# Patient Record
Sex: Female | Born: 1935 | Race: White | Hispanic: No | Smoking: Former smoker
Health system: Southern US, Community
[De-identification: ages and names within clinical notes are randomized; demographics above are authoritative.]

## PROBLEM LIST (undated history)

## (undated) DIAGNOSIS — J449 Chronic obstructive pulmonary disease, unspecified: Secondary | ICD-10-CM

## (undated) DIAGNOSIS — G459 Transient cerebral ischemic attack, unspecified: Secondary | ICD-10-CM

## (undated) DIAGNOSIS — F0280 Dementia in other diseases classified elsewhere without behavioral disturbance: Secondary | ICD-10-CM

## (undated) DIAGNOSIS — M419 Scoliosis, unspecified: Secondary | ICD-10-CM

## (undated) DIAGNOSIS — I10 Essential (primary) hypertension: Secondary | ICD-10-CM

## (undated) DIAGNOSIS — Z720 Tobacco use: Secondary | ICD-10-CM

## (undated) DIAGNOSIS — I350 Nonrheumatic aortic (valve) stenosis: Secondary | ICD-10-CM

## (undated) DIAGNOSIS — Z7901 Long term (current) use of anticoagulants: Secondary | ICD-10-CM

## (undated) DIAGNOSIS — K225 Diverticulum of esophagus, acquired: Secondary | ICD-10-CM

## (undated) DIAGNOSIS — E538 Deficiency of other specified B group vitamins: Secondary | ICD-10-CM

## (undated) DIAGNOSIS — I4891 Unspecified atrial fibrillation: Secondary | ICD-10-CM

## (undated) DIAGNOSIS — G309 Alzheimer's disease, unspecified: Secondary | ICD-10-CM

## (undated) HISTORY — DX: Transient cerebral ischemic attack, unspecified: G45.9

## (undated) HISTORY — DX: Tobacco use: Z72.0

## (undated) HISTORY — DX: Diverticulum of esophagus, acquired: K22.5

## (undated) HISTORY — PX: CATARACT EXTRACTION, BILATERAL: SHX1313

## (undated) HISTORY — DX: Long term (current) use of anticoagulants: Z79.01

## (undated) HISTORY — DX: Essential (primary) hypertension: I10

## (undated) HISTORY — PX: TONSILLECTOMY: SUR1361

## (undated) HISTORY — DX: Unspecified atrial fibrillation: I48.91

---

## 1958-08-04 HISTORY — PX: DILATION AND CURETTAGE, DIAGNOSTIC / THERAPEUTIC: SUR384

## 1995-08-05 HISTORY — PX: THALAMOTOMY: SHX797

## 2001-01-01 ENCOUNTER — Ambulatory Visit (HOSPITAL_COMMUNITY): Admission: RE | Admit: 2001-01-01 | Discharge: 2001-01-01 | Payer: Self-pay | Admitting: Internal Medicine

## 2001-01-01 ENCOUNTER — Encounter: Payer: Self-pay | Admitting: Internal Medicine

## 2002-01-03 ENCOUNTER — Encounter: Payer: Self-pay | Admitting: Internal Medicine

## 2002-01-03 ENCOUNTER — Ambulatory Visit (HOSPITAL_COMMUNITY): Admission: RE | Admit: 2002-01-03 | Discharge: 2002-01-03 | Payer: Self-pay | Admitting: Internal Medicine

## 2002-03-21 ENCOUNTER — Encounter: Payer: Self-pay | Admitting: Internal Medicine

## 2002-03-21 ENCOUNTER — Ambulatory Visit (HOSPITAL_COMMUNITY): Admission: RE | Admit: 2002-03-21 | Discharge: 2002-03-21 | Payer: Self-pay | Admitting: Internal Medicine

## 2002-05-18 ENCOUNTER — Ambulatory Visit (HOSPITAL_COMMUNITY): Admission: RE | Admit: 2002-05-18 | Discharge: 2002-05-18 | Payer: Self-pay | Admitting: Otolaryngology

## 2002-05-18 ENCOUNTER — Encounter: Payer: Self-pay | Admitting: Otolaryngology

## 2002-05-24 ENCOUNTER — Ambulatory Visit (HOSPITAL_COMMUNITY): Admission: RE | Admit: 2002-05-24 | Discharge: 2002-05-24 | Payer: Self-pay | Admitting: Otolaryngology

## 2002-05-24 ENCOUNTER — Encounter: Payer: Self-pay | Admitting: Otolaryngology

## 2002-06-14 ENCOUNTER — Ambulatory Visit (HOSPITAL_COMMUNITY): Admission: RE | Admit: 2002-06-14 | Discharge: 2002-06-14 | Payer: Self-pay | Admitting: Otolaryngology

## 2002-09-08 ENCOUNTER — Ambulatory Visit (HOSPITAL_COMMUNITY): Admission: RE | Admit: 2002-09-08 | Discharge: 2002-09-09 | Payer: Self-pay | Admitting: Otolaryngology

## 2002-12-12 ENCOUNTER — Encounter: Payer: Self-pay | Admitting: Otolaryngology

## 2002-12-12 ENCOUNTER — Ambulatory Visit (HOSPITAL_COMMUNITY): Admission: RE | Admit: 2002-12-12 | Discharge: 2002-12-12 | Payer: Self-pay | Admitting: Otolaryngology

## 2003-01-13 ENCOUNTER — Ambulatory Visit (HOSPITAL_COMMUNITY): Admission: RE | Admit: 2003-01-13 | Discharge: 2003-01-13 | Payer: Self-pay | Admitting: Internal Medicine

## 2003-01-13 ENCOUNTER — Encounter: Payer: Self-pay | Admitting: Internal Medicine

## 2003-08-05 DIAGNOSIS — I4891 Unspecified atrial fibrillation: Secondary | ICD-10-CM

## 2003-08-05 DIAGNOSIS — Z7901 Long term (current) use of anticoagulants: Secondary | ICD-10-CM

## 2003-08-05 HISTORY — DX: Long term (current) use of anticoagulants: Z79.01

## 2003-08-05 HISTORY — DX: Unspecified atrial fibrillation: I48.91

## 2004-01-15 ENCOUNTER — Ambulatory Visit (HOSPITAL_COMMUNITY): Admission: RE | Admit: 2004-01-15 | Discharge: 2004-01-15 | Payer: Self-pay | Admitting: Internal Medicine

## 2004-03-06 ENCOUNTER — Ambulatory Visit (HOSPITAL_COMMUNITY): Admission: RE | Admit: 2004-03-06 | Discharge: 2004-03-06 | Payer: Self-pay | Admitting: *Deleted

## 2004-03-11 ENCOUNTER — Encounter: Admission: RE | Admit: 2004-03-11 | Discharge: 2004-03-11 | Payer: Self-pay | Admitting: Internal Medicine

## 2004-08-04 HISTORY — PX: ZENKER'S DIVERTICULECTOMY: SHX5223

## 2004-11-18 ENCOUNTER — Ambulatory Visit (HOSPITAL_COMMUNITY): Admission: RE | Admit: 2004-11-18 | Discharge: 2004-11-18 | Payer: Self-pay | Admitting: Ophthalmology

## 2005-01-28 ENCOUNTER — Ambulatory Visit (HOSPITAL_COMMUNITY): Admission: RE | Admit: 2005-01-28 | Discharge: 2005-01-28 | Payer: Self-pay | Admitting: Internal Medicine

## 2005-03-10 ENCOUNTER — Ambulatory Visit (HOSPITAL_COMMUNITY): Admission: RE | Admit: 2005-03-10 | Discharge: 2005-03-10 | Payer: Self-pay | Admitting: Ophthalmology

## 2005-03-26 ENCOUNTER — Ambulatory Visit (HOSPITAL_COMMUNITY): Admission: RE | Admit: 2005-03-26 | Discharge: 2005-03-26 | Payer: Self-pay | Admitting: Internal Medicine

## 2005-04-02 ENCOUNTER — Encounter: Admission: RE | Admit: 2005-04-02 | Discharge: 2005-04-02 | Payer: Self-pay | Admitting: Internal Medicine

## 2005-04-02 ENCOUNTER — Encounter (INDEPENDENT_AMBULATORY_CARE_PROVIDER_SITE_OTHER): Payer: Self-pay | Admitting: *Deleted

## 2005-09-15 ENCOUNTER — Ambulatory Visit (HOSPITAL_COMMUNITY): Admission: RE | Admit: 2005-09-15 | Discharge: 2005-09-15 | Payer: Self-pay | Admitting: Internal Medicine

## 2006-03-17 ENCOUNTER — Ambulatory Visit (HOSPITAL_COMMUNITY): Admission: RE | Admit: 2006-03-17 | Discharge: 2006-03-17 | Payer: Self-pay | Admitting: Internal Medicine

## 2009-04-06 ENCOUNTER — Encounter (INDEPENDENT_AMBULATORY_CARE_PROVIDER_SITE_OTHER): Payer: Self-pay | Admitting: *Deleted

## 2009-04-06 LAB — CONVERTED CEMR LAB
AST: 25 units/L
Albumin: 4.2 g/dL
Alkaline Phosphatase: 44 units/L
BUN: 11 mg/dL
Calcium: 9.5 mg/dL
Creatinine, Ser: 0.65 mg/dL
Glucose, Bld: 102 mg/dL
HDL: 82 mg/dL
Potassium: 4.7 meq/L
Total Protein: 6.9 g/dL
Triglycerides: 65 mg/dL

## 2009-04-19 ENCOUNTER — Encounter: Payer: Self-pay | Admitting: Cardiology

## 2009-04-30 DIAGNOSIS — I10 Essential (primary) hypertension: Secondary | ICD-10-CM | POA: Insufficient documentation

## 2009-05-01 ENCOUNTER — Encounter (INDEPENDENT_AMBULATORY_CARE_PROVIDER_SITE_OTHER): Payer: Self-pay | Admitting: *Deleted

## 2009-05-01 ENCOUNTER — Ambulatory Visit: Payer: Self-pay | Admitting: Cardiology

## 2009-05-01 DIAGNOSIS — M810 Age-related osteoporosis without current pathological fracture: Secondary | ICD-10-CM | POA: Insufficient documentation

## 2009-05-01 LAB — CONVERTED CEMR LAB
ALT: 19 units/L
AST: 28 units/L
Alkaline Phosphatase: 54 units/L
Calcium: 9.8 mg/dL
Glucose, Bld: 84 mg/dL
Total Protein: 7.4 g/dL

## 2009-05-02 ENCOUNTER — Ambulatory Visit (HOSPITAL_COMMUNITY): Admission: RE | Admit: 2009-05-02 | Discharge: 2009-05-02 | Payer: Self-pay | Admitting: Cardiology

## 2009-05-02 ENCOUNTER — Ambulatory Visit: Payer: Self-pay | Admitting: Cardiology

## 2009-05-02 ENCOUNTER — Encounter: Payer: Self-pay | Admitting: Cardiology

## 2009-05-04 ENCOUNTER — Encounter (INDEPENDENT_AMBULATORY_CARE_PROVIDER_SITE_OTHER): Payer: Self-pay | Admitting: *Deleted

## 2009-05-09 ENCOUNTER — Ambulatory Visit: Payer: Self-pay | Admitting: Cardiology

## 2009-05-09 LAB — CONVERTED CEMR LAB: POC INR: 1.6

## 2009-05-16 ENCOUNTER — Ambulatory Visit: Payer: Self-pay | Admitting: Cardiology

## 2009-05-16 LAB — CONVERTED CEMR LAB: POC INR: 2.1

## 2009-05-18 ENCOUNTER — Encounter (INDEPENDENT_AMBULATORY_CARE_PROVIDER_SITE_OTHER): Payer: Self-pay | Admitting: *Deleted

## 2009-05-18 LAB — CONVERTED CEMR LAB: OCCULT 1: NEGATIVE

## 2009-05-23 ENCOUNTER — Ambulatory Visit: Payer: Self-pay | Admitting: Cardiology

## 2009-05-23 LAB — CONVERTED CEMR LAB: POC INR: 2

## 2009-05-31 ENCOUNTER — Ambulatory Visit: Payer: Self-pay | Admitting: Cardiology

## 2009-05-31 LAB — CONVERTED CEMR LAB: POC INR: 3.2

## 2009-06-06 ENCOUNTER — Ambulatory Visit: Payer: Self-pay | Admitting: Cardiology

## 2009-06-12 ENCOUNTER — Ambulatory Visit: Payer: Self-pay | Admitting: Cardiology

## 2009-06-12 ENCOUNTER — Encounter: Payer: Self-pay | Admitting: Cardiology

## 2009-06-27 ENCOUNTER — Ambulatory Visit: Payer: Self-pay | Admitting: Cardiology

## 2009-07-11 ENCOUNTER — Ambulatory Visit: Payer: Self-pay | Admitting: Cardiovascular Disease

## 2009-07-13 ENCOUNTER — Encounter (INDEPENDENT_AMBULATORY_CARE_PROVIDER_SITE_OTHER): Payer: Self-pay | Admitting: *Deleted

## 2009-07-17 ENCOUNTER — Encounter (INDEPENDENT_AMBULATORY_CARE_PROVIDER_SITE_OTHER): Payer: Self-pay | Admitting: *Deleted

## 2009-07-17 ENCOUNTER — Ambulatory Visit: Payer: Self-pay | Admitting: Cardiology

## 2009-07-17 LAB — CONVERTED CEMR LAB
Calcium: 11.2 mg/dL
Chloride: 96 meq/L
Creatinine, Ser: 0.68 mg/dL
Magnesium: 1.9 mg/dL
Sodium: 134 meq/L

## 2009-08-02 ENCOUNTER — Ambulatory Visit: Payer: Self-pay | Admitting: Cardiology

## 2009-08-30 ENCOUNTER — Ambulatory Visit: Payer: Self-pay | Admitting: Cardiovascular Disease

## 2009-09-19 ENCOUNTER — Ambulatory Visit: Payer: Self-pay | Admitting: Cardiovascular Disease

## 2009-10-11 ENCOUNTER — Encounter (INDEPENDENT_AMBULATORY_CARE_PROVIDER_SITE_OTHER): Payer: Self-pay | Admitting: *Deleted

## 2009-10-15 ENCOUNTER — Ambulatory Visit: Payer: Self-pay | Admitting: Cardiology

## 2009-10-16 ENCOUNTER — Encounter: Payer: Self-pay | Admitting: Cardiology

## 2009-10-16 ENCOUNTER — Encounter (INDEPENDENT_AMBULATORY_CARE_PROVIDER_SITE_OTHER): Payer: Self-pay | Admitting: *Deleted

## 2009-10-16 LAB — CONVERTED CEMR LAB
Albumin: 4 g/dL (ref 3.5–5.2)
Alkaline Phosphatase: 44 units/L (ref 39–117)
BUN: 17 mg/dL (ref 6–23)
Basophils Relative: 0 % (ref 0–1)
CO2: 26 meq/L (ref 19–32)
Calcium: 9.9 mg/dL (ref 8.4–10.5)
Eosinophils Absolute: 0.1 10*3/uL (ref 0.0–0.7)
Eosinophils Relative: 1 % (ref 0–5)
Glucose, Bld: 91 mg/dL (ref 70–99)
HCT: 38.8 % (ref 36.0–46.0)
Hemoglobin: 13.3 g/dL (ref 12.0–15.0)
Lymphs Abs: 1.5 10*3/uL (ref 0.7–4.0)
MCHC: 34.3 g/dL (ref 30.0–36.0)
Monocytes Absolute: 0.8 10*3/uL (ref 0.1–1.0)
Monocytes Relative: 10 % (ref 3–12)
Neutro Abs: 5.7 10*3/uL (ref 1.7–7.7)
Neutrophils Relative %: 70 % (ref 43–77)
OCCULT 3: NEGATIVE
Platelets: 283 10*3/uL (ref 150–400)
Sodium: 132 meq/L — ABNORMAL LOW (ref 135–145)
Total Bilirubin: 0.8 mg/dL (ref 0.3–1.2)
Total Protein: 6.6 g/dL (ref 6.0–8.3)
WBC: 8.1 10*3/uL (ref 4.0–10.5)

## 2009-10-17 ENCOUNTER — Encounter (INDEPENDENT_AMBULATORY_CARE_PROVIDER_SITE_OTHER): Payer: Self-pay | Admitting: *Deleted

## 2009-10-19 ENCOUNTER — Encounter (INDEPENDENT_AMBULATORY_CARE_PROVIDER_SITE_OTHER): Payer: Self-pay | Admitting: *Deleted

## 2009-10-19 ENCOUNTER — Ambulatory Visit: Payer: Self-pay | Admitting: Cardiology

## 2009-10-29 ENCOUNTER — Ambulatory Visit: Payer: Self-pay | Admitting: Cardiology

## 2009-10-29 LAB — CONVERTED CEMR LAB: POC INR: 1.8

## 2009-11-12 ENCOUNTER — Ambulatory Visit: Payer: Self-pay | Admitting: Cardiology

## 2009-11-12 LAB — CONVERTED CEMR LAB: POC INR: 3.6

## 2009-11-26 ENCOUNTER — Ambulatory Visit: Payer: Self-pay | Admitting: Cardiology

## 2009-11-26 LAB — CONVERTED CEMR LAB: POC INR: 1.6

## 2009-12-10 ENCOUNTER — Ambulatory Visit: Payer: Self-pay | Admitting: Cardiology

## 2009-12-10 LAB — CONVERTED CEMR LAB: POC INR: 2.7

## 2009-12-21 ENCOUNTER — Encounter (INDEPENDENT_AMBULATORY_CARE_PROVIDER_SITE_OTHER): Payer: Self-pay

## 2009-12-24 ENCOUNTER — Ambulatory Visit: Payer: Self-pay | Admitting: Cardiology

## 2009-12-24 LAB — CONVERTED CEMR LAB: POC INR: 2.5

## 2010-01-21 ENCOUNTER — Ambulatory Visit: Payer: Self-pay | Admitting: Cardiovascular Disease

## 2010-01-21 LAB — CONVERTED CEMR LAB: POC INR: 2.7

## 2010-02-20 ENCOUNTER — Ambulatory Visit: Payer: Self-pay | Admitting: Cardiology

## 2010-03-18 ENCOUNTER — Ambulatory Visit: Payer: Self-pay | Admitting: Cardiology

## 2010-04-15 ENCOUNTER — Ambulatory Visit: Payer: Self-pay | Admitting: Cardiology

## 2010-04-15 LAB — CONVERTED CEMR LAB: POC INR: 2.8

## 2010-05-13 ENCOUNTER — Ambulatory Visit: Payer: Self-pay | Admitting: Cardiology

## 2010-06-03 ENCOUNTER — Ambulatory Visit: Payer: Self-pay | Admitting: Cardiology

## 2010-06-03 LAB — CONVERTED CEMR LAB: POC INR: 2.1

## 2010-07-01 ENCOUNTER — Ambulatory Visit: Payer: Self-pay | Admitting: Cardiology

## 2010-07-22 ENCOUNTER — Ambulatory Visit: Payer: Self-pay | Admitting: Cardiology

## 2010-07-22 ENCOUNTER — Encounter (INDEPENDENT_AMBULATORY_CARE_PROVIDER_SITE_OTHER): Payer: Self-pay | Admitting: *Deleted

## 2010-07-22 ENCOUNTER — Encounter: Payer: Self-pay | Admitting: Cardiology

## 2010-07-23 ENCOUNTER — Encounter: Payer: Self-pay | Admitting: Adult Health

## 2010-08-19 ENCOUNTER — Ambulatory Visit: Admit: 2010-08-19 | Payer: Self-pay

## 2010-08-25 ENCOUNTER — Encounter: Payer: Self-pay | Admitting: Internal Medicine

## 2010-08-26 ENCOUNTER — Ambulatory Visit
Admission: RE | Admit: 2010-08-26 | Discharge: 2010-08-26 | Payer: Self-pay | Source: Home / Self Care | Attending: Cardiology | Admitting: Cardiology

## 2010-08-26 ENCOUNTER — Encounter: Payer: Self-pay | Admitting: Cardiology

## 2010-09-01 LAB — CONVERTED CEMR LAB
AST: 28 units/L (ref 0–37)
Albumin: 4.1 g/dL (ref 3.5–5.2)
Alkaline Phosphatase: 54 units/L (ref 39–117)
BUN: 14 mg/dL (ref 6–23)
Basophils Relative: 0 % (ref 0–1)
Calcium: 9.8 mg/dL (ref 8.4–10.5)
Glucose, Bld: 92 mg/dL (ref 70–99)
HCT: 42.8 % (ref 36.0–46.0)
Hemoglobin: 14.9 g/dL (ref 12.0–15.0)
Lymphocytes Relative: 22 % (ref 12–46)
Lymphs Abs: 1.6 10*3/uL (ref 0.7–4.0)
MCHC: 34.8 g/dL (ref 30.0–36.0)
MCV: 101.7 fL — ABNORMAL HIGH (ref 78.0–100.0)
Monocytes Relative: 11 % (ref 3–12)
Neutrophils Relative %: 66 % (ref 43–77)
Platelets: 304 10*3/uL (ref 150–400)
Potassium: 3.9 meq/L (ref 3.5–5.3)
Potassium: 5 meq/L (ref 3.5–5.3)
Prothrombin Time: 12 s (ref 11.6–15.2)
RDW: 12.2 % (ref 11.5–15.5)
Sodium: 134 meq/L — ABNORMAL LOW (ref 135–145)
TSH: 1.313 microintl units/mL (ref 0.350–4.500)
aPTT: 25 s (ref 24–37)

## 2010-09-03 NOTE — Medication Information (Signed)
Summary: ccr-lr  Anticoagulant Therapy  Managed by: Vashti Hey, RN PCP: Dr. Carylon Perches Supervising MD: Eden Emms MD, Theron Arista Indication 1: Atrial Fibrillation Lab Used: [facility] [place] Site: [place] INR POC 3.1  Dietary changes: no    Health status changes: no    Bleeding/hemorrhagic complications: no    Recent/future hospitalizations: no    Any changes in medication regimen? no    Recent/future dental: no  Any missed doses?: no       Is patient compliant with meds? yes       Allergies: 1)  ! * Flu Vaccination  Anticoagulation Management History:      The patient is taking warfarin and comes in today for a routine follow up visit.  Positive risk factors for bleeding include an age of 26 years or older and history of CVA/TIA.  Negative risk factors for bleeding include no history of GI bleeding and absence of serious comorbidities.  The bleeding index is 'intermediate risk'.  Positive CHADS2 values include History of HTN, Age > 32 years old, and Prior Stroke/CVA/TIA.  Negative CHADS2 values include History of CHF and History of Diabetes.  Her last INR was 0.9.  Anticoagulation responsible provider: Eden Emms MD, Theron Arista.  INR POC: 3.1.  Cuvette Lot#: [id number].  Exp: 10/11.    Anticoagulation Management Assessment/Plan:      The patient's current anticoagulation dose is Coumadin 5 mg tabs: Take 1 tablet by mouth once daily as directed by [facility] Clinic, Coumadin 5 mg tabs: Sunday - 1 tab, Monday - 0.5 tab, Tuesday - 1 tab, Wednesday - 0.5 tab, Thursday - 1 tab, Friday - 0.5 tab, Saturday - 1 tab.  The target INR is 2.0-3.0.  The next INR is due 10/15/2009.  Anticoagulation instructions were given to patient.  Results were reviewed/authorized by Vashti Hey, RN.  She was notified by Vashti Hey RN.         Prior Anticoagulation Instructions: INR 3.6 Do not take couamdin Friday 08/31/09 then decrease dose to 5mg  once daily except 2.5mg  on Mondays, Wednesdays and  Fridays  Current Anticoagulation Instructions: INR 3.1 Decrease coumadin to 1/2 tablet once daily except 1 tablet on Sundays, Tuesdays and Thursdays

## 2010-09-03 NOTE — Medication Information (Signed)
Summary: ccr-lr  Anticoagulant Therapy  Managed by: [name] Hey, RN PCP: Dr. Carylon Perches Supervising MD: Dietrich Pates MD, Molly Maduro Indication 1: Atrial Fibrillation Lab Used: [facility] [place] Site: [place] INR POC 1.8  Dietary changes: no    Health status changes: no    Bleeding/hemorrhagic complications: no    Recent/future hospitalizations: no    Any changes in medication regimen? no    Recent/future dental: no  Any missed doses?: no       Is patient compliant with meds? yes       Allergies: 1)  ! * Flu Vaccination  Anticoagulation Management History:      The patient is taking warfarin and comes in today for a routine follow up visit.  Positive risk factors for bleeding include an age of 75 years or older and history of CVA/TIA.  Negative risk factors for bleeding include no history of GI bleeding and absence of serious comorbidities.  The bleeding index is 'intermediate risk'.  Positive CHADS2 values include History of HTN, Age > 64 years old, and Prior Stroke/CVA/TIA.  Negative CHADS2 values include History of CHF and History of Diabetes.  Her last INR was 0.9.  Anticoagulation responsible provider: Dietrich Pates MD, Molly Maduro.  INR POC: 1.8.  Cuvette Lot#: [id number].  Exp: 10/11.    Anticoagulation Management Assessment/Plan:      The patient's current anticoagulation dose is Coumadin 5 mg tabs: Take 1 tablet by mouth once daily as directed by [facility] Clinic.  The target INR is 2.0-3.0.  The next INR is due 11/12/2009.  Anticoagulation instructions were given to patient.  Results were reviewed/authorized by [name] Hey, RN.  She was notified by [name] Hey RN.        Coagulation management information includes: Pending DCCV after theraputic 4 weeks     06/27/09  DCCV on hold for now .  Prior Anticoagulation Instructions: INR 1.6 Take coumadin 1 tablet tonight then increase dose to 1 tablet once daily except 1/2 tablet on Mondays, Wednesdays and Fridays  Current  Anticoagulation Instructions: INR 1.8  Increase coumadin to 5mg  once daily except 2.5mg  on Wednesdays and Fridays  Appended Document: ccr-lr Take coumadin 5mg  tonight.

## 2010-09-03 NOTE — Letter (Signed)
Summary: [facility] [employer] [facility], [facility] at [facility]  [address] [place] [address]   Phone: [phone]  Fax: [phone]      October 17, 2009 MRN: [id number]   [name] Young [address] [place], [place]  [address]   Dear Ms. Josey,  Your test ordered by Selena Batten has been reviewed by your physician (or physician assistant) and was found to be normal or stable. Your physician (or physician assistant) felt no changes were needed at this time.  ____ Echocardiogram  ____ Cardiac Stress Test  _x_ Lab Work  ____ Peripheral vascular study of arms, legs or neck  ____ CT scan or X-ray  ____ Lung or Breathing test  ____ Other:  No change in medical treatment at this time, per Dr. Dietrich Pates.  Enclosed is a copy of your labwork for your records.  Thank you, Ulas Zuercher Allyne Gee RN    Plover Bing, MD, Lenise Arena.C.Gaylord Shih, MD, F.A.C.C Lewayne Bunting, MD, F.A.C.C Nona Dell, MD, F.A.C.C Charlton Haws, MD, Lenise Arena.C.C

## 2010-09-03 NOTE — Medication Information (Signed)
Summary: ccr-lr  Anticoagulant Therapy  Managed by: Vashti Hey, RN PCP: Dr. Carylon Perches Supervising MD: Diona Browner MD, Remi Deter Indication 1: Atrial Fibrillation Lab Used: [facility] [place] Site: [place] INR POC 1.6  Dietary changes: no    Health status changes: no    Bleeding/hemorrhagic complications: no    Recent/future hospitalizations: no    Any changes in medication regimen? no    Recent/future dental: no  Any missed doses?: no       Is patient compliant with meds? yes       Allergies: 1)  ! * Flu Vaccination  Anticoagulation Management History:      The patient is taking warfarin and comes in today for a routine follow up visit.  Positive risk factors for bleeding include an age of 75 years or older and history of CVA/TIA.  Negative risk factors for bleeding include no history of GI bleeding and absence of serious comorbidities.  The bleeding index is 'intermediate risk'.  Positive CHADS2 values include History of HTN, Age > 42 years old, and Prior Stroke/CVA/TIA.  Negative CHADS2 values include History of CHF and History of Diabetes.  Her last INR was 0.9.  Anticoagulation responsible provider: Diona Browner MD, Remi Deter.  INR POC: 1.6.  Cuvette Lot#: [id number].  Exp: 10/11.    Anticoagulation Management Assessment/Plan:      The patient's current anticoagulation dose is Coumadin 5 mg tabs: Take 1 tablet by mouth once daily as directed by [facility].  The target INR is 2.0-3.0.  The next INR is due 12/10/2009.  Anticoagulation instructions were given to patient.  Results were reviewed/authorized by Vashti Hey, RN.  She was notified by Vashti Hey RN.         Prior Anticoagulation Instructions: INR 3.6 Hold coumadin tonight then decrease dose to 5mg  once daily except 2.5mg  on Mondays, Wednesdays and Fridays  Current Anticoagulation Instructions: INR 1.6 Take couamdin 1 1/2 tablets tonight then increase dose to 1 tablet once daily except 1/2 tablet on Tuesdays  and Fridays

## 2010-09-03 NOTE — Medication Information (Signed)
Summary: ccr at RR appt-lr  Anticoagulant Therapy  Managed by: Vashti Hey, RN PCP: Dr. Carylon Perches Supervising MD: Dietrich Pates MD, Molly Maduro Indication 1: Atrial Fibrillation Lab Used: [facility] [place] Site: [place] INR POC 1.6  Dietary changes: no    Health status changes: no    Bleeding/hemorrhagic complications: no    Recent/future hospitalizations: no    Any changes in medication regimen? no    Recent/future dental: no  Any missed doses?: no       Is patient compliant with meds? yes       Allergies: 1)  ! * Flu Vaccination  Anticoagulation Management History:      The patient is taking warfarin and comes in today for a routine follow up visit.  Positive risk factors for bleeding include an age of 75 years or older and history of CVA/TIA.  Negative risk factors for bleeding include no history of GI bleeding and absence of serious comorbidities.  The bleeding index is 'intermediate risk'.  Positive CHADS2 values include History of HTN, Age > 22 years old, and Prior Stroke/CVA/TIA.  Negative CHADS2 values include History of CHF and History of Diabetes.  Her last INR was 0.9.  Anticoagulation responsible provider: Dietrich Pates MD, Molly Maduro.  INR POC: 1.6.  Cuvette Lot#: [id number].  Exp: 10/11.    Anticoagulation Management Assessment/Plan:      The patient's current anticoagulation dose is Coumadin 5 mg tabs: Take 1 tablet by mouth once daily as directed by [facility]mad[facility] Clinic, Coumadin 5 mg tabs: Sunday - 1 tab, Monday - 0.5 tab, Tuesday - 1 tab, Wednesday - 0.5 tab, Thursday - 1 tab, Friday - 0.5 tab, Saturday - 1 tab.  The target INR is 2.0-3.0.  The next INR is due 10/29/2009.  Anticoagulation instructions were given to patient.  Results were reviewed/authorized by Vashti Hey, RN.  She was notified by Vashti Hey RN.         Prior Anticoagulation Instructions: INR 3.1 Decrease coumadin to 1/2 tablet once daily except 1 tablet on Sundays, Tuesdays and Thursdays  Current  Anticoagulation Instructions: INR 1.6 Take coumadin 1 tablet tonight then increase dose to 1 tablet once daily except 1/2 tablet on Mondays, Wednesdays and Fridays

## 2010-09-03 NOTE — Medication Information (Signed)
Summary: ccr-lr  Anticoagulant Therapy  Managed by: Vashti Hey, RN PCP: Dr. Carylon Perches Supervising MD: Dietrich Pates MD, Molly Maduro Indication 1: Atrial Fibrillation Lab Used: [facility] [place] Site: [place] INR POC 3.6  Dietary changes: no    Health status changes: no    Bleeding/hemorrhagic complications: no    Recent/future hospitalizations: no    Any changes in medication regimen? no    Recent/future dental: no  Any missed doses?: no       Is patient compliant with meds? yes       Allergies: 1)  ! * Flu Vaccination  Anticoagulation Management History:      The patient is taking warfarin and comes in today for a routine follow up visit.  Positive risk factors for bleeding include an age of 74 years or older and history of CVA/TIA.  Negative risk factors for bleeding include no history of GI bleeding and absence of serious comorbidities.  The bleeding index is 'intermediate risk'.  Positive CHADS2 values include History of HTN, Age > 77 years old, and Prior Stroke/CVA/TIA.  Negative CHADS2 values include History of CHF and History of Diabetes.  Her last INR was 0.9.  Anticoagulation responsible provider: Dietrich Pates MD, Molly Maduro.  INR POC: 3.6.  Cuvette Lot#: [id number].  Exp: 10/11.    Anticoagulation Management Assessment/Plan:      The patient's current anticoagulation dose is Coumadin 5 mg tabs: Take 1 tablet by mouth once daily as directed by [facility] Clinic.  The target INR is 2.0-3.0.  The next INR is due 11/26/2009.  Anticoagulation instructions were given to patient.  Results were reviewed/authorized by Vashti Hey, RN.  She was notified by Vashti Hey RN.         Prior Anticoagulation Instructions: INR 1.8  Increase coumadin to 5mg  once daily except 2.5mg  on Wednesdays and Fridays  Current Anticoagulation Instructions: INR 3.6 Hold coumadin tonight then decrease dose to 5mg  once daily except 2.5mg  on Mondays, Wednesdays and Fridays

## 2010-09-03 NOTE — Medication Information (Signed)
Summary: rov/sp  Anticoagulant Therapy  Managed by: Vashti Hey, RN PCP: Dr. Carylon Perches Supervising MD: Daleen Squibb MD, Maisie Fus Indication 1: Atrial Fibrillation Lab Used: [facility] [place] Site: [place] INR POC 2.5 INR RANGE 2.0-3.0  Dietary changes: no    Health status changes: no    Bleeding/hemorrhagic complications: no    Recent/future hospitalizations: no    Any changes in medication regimen? no    Recent/future dental: no  Any missed doses?: no       Is patient compliant with meds? yes       Allergies: 1)  ! * Flu Vaccination  Anticoagulation Management History:      The patient is taking warfarin and comes in today for a routine follow up visit.  Positive risk factors for bleeding include an age of 50 years or older and history of CVA/TIA.  Negative risk factors for bleeding include no history of GI bleeding and absence of serious comorbidities.  The bleeding index is 'intermediate risk'.  Positive CHADS2 values include History of HTN, Age > 69 years old, and Prior Stroke/CVA/TIA.  Negative CHADS2 values include History of CHF and History of Diabetes.  Her last INR was 0.9.  Anticoagulation responsible provider: Daleen Squibb MD, Maisie Fus.  INR POC: 2.5.  Cuvette Lot#: [id number].  Exp: 03/2011.    Anticoagulation Management Assessment/Plan:      The patient's current anticoagulation dose is Coumadin 5 mg tabs: Take 1 tablet by mouth once daily as directed by [facility] Clinic.  The target INR is 2.0-3.0.  The next INR is due 03/18/2010.  Anticoagulation instructions were given to patient.  Results were reviewed/authorized by Vashti Hey, RN.  She was notified by Vashti Hey RN.         Prior Anticoagulation Instructions: INR 2.7  Continue same dose of 1 tablet every day except 1/2 tablet on Tuesday and Friday.   Current Anticoagulation Instructions: INR 2.5 Continue coumadin 5mg  once daily except 2.5mg  on Tuesdays and Fridays

## 2010-09-03 NOTE — Letter (Signed)
Summary: Recall, Screening Colonoscopy Only  [facility]  [address]   [place], [place] [address]   Phone: [phone]  Fax: [phone]    Dec 21, 2009  [name] Young [address] [place], [place]  [address] 1926/08/07   Dear Ms. Emily,   Our records indicate it is time to schedule your colonoscopy.    Please call our office at [phone] and ask for the nurse.   Thank you,  Hendricks Limes, LPN Cloria Spring, LPN  [facility] Ph: [phone]   Fax: [phone]

## 2010-09-03 NOTE — Medication Information (Signed)
Summary: ccr-lr  Anticoagulant Therapy  Managed by: Vashti Hey, RN PCP: Dr. Carylon Perches Supervising MD: Eden Emms MD, Theron Arista Indication 1: Atrial Fibrillation Lab Used: [facility] [place] Site: [place] INR POC 3.6  Dietary changes: no    Health status changes: yes       Details: bad cold x 3 weeks  Bleeding/hemorrhagic complications: no    Recent/future hospitalizations: no    Any changes in medication regimen? no    Recent/future dental: no  Any missed doses?: no       Is patient compliant with meds? yes       Allergies: 1)  ! * Flu Vaccination  Anticoagulation Management History:      The patient is taking warfarin and comes in today for a routine follow up visit.  Positive risk factors for bleeding include an age of 42 years or older and history of CVA/TIA.  Negative risk factors for bleeding include no history of GI bleeding and absence of serious comorbidities.  The bleeding index is 'intermediate risk'.  Positive CHADS2 values include History of HTN, Age > 12 years old, and Prior Stroke/CVA/TIA.  Negative CHADS2 values include History of CHF and History of Diabetes.  Her last INR was 0.9.  Anticoagulation responsible provider: Eden Emms MD, Theron Arista.  INR POC: 3.6.  Cuvette Lot#: [id number].  Exp: 10/11.    Anticoagulation Management Assessment/Plan:      The patient's current anticoagulation dose is Coumadin 5 mg tabs: Take 1 tablet by mouth once daily as directed by Coumadin Clinic, Coumadin 5 mg tabs: Sunday - 1 tab, Monday - 0.5 tab, Tuesday - 1 tab, Wednesday - 0.5 tab, Thursday - 1 tab, Friday - 0.5 tab, Saturday - 1 tab.  The target INR is 2.0-3.0.  The next INR is due 09/19/2009.  Anticoagulation instructions were given to patient.  Results were reviewed/authorized by Vashti Hey, RN.  She was notified by Vashti Hey RN.         Prior Anticoagulation Instructions: INR 2.2  Continue coumadin 1 tablet once daily except 1/2 tablet on Mondays and Fridays  Current  Anticoagulation Instructions: INR 3.6 Do not take couamdin Friday 08/31/09 then decrease dose to 5mg  once daily except 2.5mg  on Mondays, Wednesdays and Fridays

## 2010-09-03 NOTE — Medication Information (Signed)
Summary: ccr-lr  Anticoagulant Therapy  Managed by: [name] Hey, RN PCP: Dr. Carylon Perches Supervising MD: Dietrich Pates MD, Molly Maduro Indication 1: Atrial Fibrillation Lab Used: [facility] [place] Site: [place] INR POC 2.5  Dietary changes: no    Health status changes: no    Bleeding/hemorrhagic complications: no    Recent/future hospitalizations: no    Any changes in medication regimen? no    Recent/future dental: no  Any missed doses?: no       Is patient compliant with meds? yes       Allergies: 1)  ! * Flu Vaccination  Anticoagulation Management History:      The patient is taking warfarin and comes in today for a routine follow up visit.  Positive risk factors for bleeding include an age of 75 years or older and history of CVA/TIA.  Negative risk factors for bleeding include no history of GI bleeding and absence of serious comorbidities.  The bleeding index is 'intermediate risk'.  Positive CHADS2 values include History of HTN, Age > 23 years old, and Prior Stroke/CVA/TIA.  Negative CHADS2 values include History of CHF and History of Diabetes.  Her last INR was 0.9.  Anticoagulation responsible provider: Dietrich Pates MD, Molly Maduro.  INR POC: 2.5.  Cuvette Lot#: [id number].  Exp: 10/11.    Anticoagulation Management Assessment/Plan:      The patient's current anticoagulation dose is Coumadin 5 mg tabs: Take 1 tablet by mouth once daily as directed by [facility]mad[facility] Clinic.  The target INR is 2.0-3.0.  The next INR is due 01/21/2010.  Anticoagulation instructions were given to patient.  Results were reviewed/authorized by [name] Hey, RN.  She was notified by [name] Hey RN.         Prior Anticoagulation Instructions: INR 2.7 Continue coumadin 5mg  once daily except 2.5mg  on Tuesdays and Fridays  Current Anticoagulation Instructions: INR 2.5 Continue coumadin 5mg  once daily except 2.5mg  on Tuesdays and Fridays

## 2010-09-03 NOTE — Miscellaneous (Signed)
Summary: stool cards 10/16/2009  Clinical Lists Changes  Observations: Added new observation of HEMOCCULT 3: neg (10/16/2009 16:24) Added new observation of HEMOCCULT 2: neg (10/16/2009 16:24) Added new observation of HEMOCCULT 1: neg (10/16/2009 16:24) 

## 2010-09-03 NOTE — Miscellaneous (Signed)
Summary: LABS BMP,MAGNESIUM 07/17/2009  Clinical Lists Changes  Observations: Added new observation of MAGNESIUM: 1.9 mg/dL (16/05/9603 54:09) Added new observation of CALCIUM: 11.2 mg/dL (81/19/1478 29:56) Added new observation of CREATININE: 0.68 mg/dL (21/30/8657 84:69) Added new observation of BUN: 14 mg/dL (62/95/2841 32:44) Added new observation of BG RANDOM: 92 mg/dL (08/06/7251 66:44) Added new observation of CO2 PLSM/SER: 30 meq/L (07/17/2009 17:04) Added new observation of CL SERUM: 96 meq/L (07/17/2009 17:04) Added new observation of K SERUM: 5.0 meq/L (07/17/2009 17:04) Added new observation of NA: 134 meq/L (07/17/2009 17:04)

## 2010-09-03 NOTE — Medication Information (Signed)
Summary: ccr-lr  Anticoagulant Therapy  Managed by: Vashti Hey, RN PCP: Dr. Carylon Perches Supervising MD: Dietrich Pates MD, Molly Maduro Indication 1: Atrial Fibrillation Lab Used: [facility] [place] Site: [place] INR POC 2.7  Dietary changes: no    Health status changes: no    Bleeding/hemorrhagic complications: no    Recent/future hospitalizations: no    Any changes in medication regimen? no    Recent/future dental: no  Any missed doses?: no       Is patient compliant with meds? yes       Allergies: 1)  ! * Flu Vaccination  Anticoagulation Management History:      The patient is taking warfarin and comes in today for a routine follow up visit.  Positive risk factors for bleeding include an age of 75 years or older and history of CVA/TIA.  Negative risk factors for bleeding include no history of GI bleeding and absence of serious comorbidities.  The bleeding index is 'intermediate risk'.  Positive CHADS2 values include History of HTN, Age > 49 years old, and Prior Stroke/CVA/TIA.  Negative CHADS2 values include History of CHF and History of Diabetes.  Her last INR was 0.9.  Anticoagulation responsible provider: Dietrich Pates MD, Molly Maduro.  INR POC: 2.7.  Cuvette Lot#: [id number].  Exp: 10/11.    Anticoagulation Management Assessment/Plan:      The patient's current anticoagulation dose is Coumadin 5 mg tabs: Take 1 tablet by mouth once daily as directed by [facility]madin Clinic.  The target INR is 2.0-3.0.  The next INR is due 12/24/2009.  Anticoagulation instructions were given to patient.  Results were reviewed/authorized by Vashti Hey, RN.  She was notified by Vashti Hey RN.         Prior Anticoagulation Instructions: INR 1.6 Take couamdin 1 1/2 tablets tonight then increase dose to 1 tablet once daily except 1/2 tablet on Tuesdays and Fridays  Current Anticoagulation Instructions: INR 2.7 Continue coumadin 5mg  once daily except 2.5mg  on Tuesdays and Fridays

## 2010-09-03 NOTE — Medication Information (Signed)
Summary: ccr-lr  Anticoagulant Therapy  Managed by: Weston Brass, PharmD PCP: Dr. Carylon Perches Supervising MD: Eden Emms MD, Theron Arista Indication 1: Atrial Fibrillation Lab Used: [facility] [place] Site: [place] INR POC 2.7 INR RANGE 2.0-3.0  Dietary changes: no    Health status changes: no    Bleeding/hemorrhagic complications: yes       Details: has large bruise on leg.  Instructed to call if a knot forms or bruise does not improve in next few weeks.   Recent/future hospitalizations: no    Any changes in medication regimen? no    Recent/future dental: no  Any missed doses?: no       Is patient compliant with meds? yes       Allergies: 1)  ! * Flu Vaccination  Anticoagulation Management History:      The patient is taking warfarin and comes in today for a routine follow up visit.  Positive risk factors for bleeding include an age of 17 years or older and history of CVA/TIA.  Negative risk factors for bleeding include no history of GI bleeding and absence of serious comorbidities.  The bleeding index is 'intermediate risk'.  Positive CHADS2 values include History of HTN, Age > 59 years old, and Prior Stroke/CVA/TIA.  Negative CHADS2 values include History of CHF and History of Diabetes.  Her last INR was 0.9.  Anticoagulation responsible provider: Eden Emms MD, Theron Arista.  INR POC: 2.7.  Cuvette Lot#: [id number].  Exp: 03/2011.    Anticoagulation Management Assessment/Plan:      The patient's current anticoagulation dose is Coumadin 5 mg tabs: Take 1 tablet by mouth once daily as directed by [facility]madin Clinic.  The target INR is 2.0-3.0.  The next INR is due 02/20/2010.  Anticoagulation instructions were given to patient.  Results were reviewed/authorized by Weston Brass, PharmD.  She was notified by Weston Brass PharmD.         Prior Anticoagulation Instructions: INR 2.5 Continue coumadin 5mg  once daily except 2.5mg  on Tuesdays and Fridays  Current Anticoagulation  Instructions: INR 2.7  Continue same dose of 1 tablet every day except 1/2 tablet on Tuesday and Friday.

## 2010-09-03 NOTE — Medication Information (Signed)
Summary: ccr-lr  Anticoagulant Therapy  Managed by: [name] Hey, RN PCP: [name] Young Supervising MD: [name] Pates MD, [name] Young Indication 1: Atrial Fibrillation Lab Used: [facility] [place] Site: [place] INR POC 2.8 INR RANGE 2.0-3.0  Dietary changes: no    Health status changes: no    Bleeding/hemorrhagic complications: no    Recent/future hospitalizations: no    Any changes in medication regimen? no    Recent/future dental: no  Any missed doses?: no       Is patient compliant with meds? yes       Allergies: 1)  ! * Flu Vaccination  Anticoagulation Management History:      The patient is taking warfarin and comes in today for a routine follow up visit.  Positive risk factors for bleeding include an age of 75 years or older and history of CVA/TIA.  Negative risk factors for bleeding include no history of GI bleeding and absence of serious comorbidities.  The bleeding index is 'intermediate risk'.  Positive CHADS2 values include History of HTN, Age > 89 years old, and Prior Stroke/CVA/TIA.  Negative CHADS2 values include History of CHF and History of Diabetes.  Her last INR was 0.9.  Anticoagulation responsible provider: Dietrich Pates MD, [name] Young.  INR POC: 2.8.  Cuvette Lot#: [id number].  Exp: 03/2011.    Anticoagulation Management Assessment/Plan:      The patient's current anticoagulation dose is Coumadin 5 mg tabs: Take 1 tablet by mouth once daily as directed by [facility].  The target INR is 2.0-3.0.  The next INR is due 05/13/2010.  Anticoagulation instructions were given to patient.  Results were reviewed/authorized by [name] Hey, RN.  She was notified by [name] Hey RN.         Prior Anticoagulation Instructions: INR 2.9 Continue coumadin 5mg  once daily except 2.5mg  on Tuesdays and Fridays  Current Anticoagulation Instructions: INR 2.8 Continue coumadin 5mg  once daily except Tuesdays and Fridays

## 2010-09-03 NOTE — Medication Information (Signed)
Summary: ccr-lr  Anticoagulant Therapy  Managed by: Vashti Hey, RN PCP: Dr. Carylon Perches Supervising MD: Dietrich Pates MD, Molly Maduro Indication 1: Atrial Fibrillation Lab Used: [facility] [place] Site: [place] INR POC 3.6 INR RANGE 2.0-3.0  Dietary changes: no    Health status changes: no    Bleeding/hemorrhagic complications: no    Recent/future hospitalizations: no    Any changes in medication regimen? no    Recent/future dental: no  Any missed doses?: no       Is patient compliant with meds? yes       Allergies: 1)  ! * Flu Vaccination  Anticoagulation Management History:      The patient is taking warfarin and comes in today for a routine follow up visit.  Positive risk factors for bleeding include an age of 75 years or older and history of CVA/TIA.  Negative risk factors for bleeding include no history of GI bleeding and absence of serious comorbidities.  The bleeding index is 'intermediate risk'.  Positive CHADS2 values include History of HTN, Age > 75 years old, and Prior Stroke/CVA/TIA.  Negative CHADS2 values include History of CHF and History of Diabetes.  Her last INR was 0.9 (77)  Anticoagulation responsible Marcee Jacobs: Dietrich Pates MD, Molly Maduro.  INR POC: 3.6.  Cuvette Lot#: [id number].  Exp: 03/2011.    Anticoagulation Management Assessment/Plan:      The patient's current anticoagulation dose is Coumadin 5 mg tabs: Take 1 tablet by mouth once daily as directed by [facility].  The target INR is 2.0-3.0.  The next INR is due 06/03/2010.  Anticoagulation instructions were given to patient.  Results were reviewed/authorized by Vashti Hey, RN.  She was notified by Vashti Hey RN.         Prior Anticoagulation Instructions: INR 2.8 Continue coumadin 5mg  once daily except Tuesdays and Fridays  Current Anticoagulation Instructions: INR 3.6 Hold coumadin tonight then resume 1 tablet once daily except 1/2 tablet on Tuesdays and Fridays

## 2010-09-03 NOTE — Medication Information (Signed)
Summary: ccr-lr  Anticoagulant Therapy  Managed by: [name] Hey, RN PCP: [name] Young Supervising Young: [name] Young, Maisie Fus Indication 1: Atrial Fibrillation Lab Used: [facility] [place] Site:  INR POC 2.9 INR RANGE 2.0-3.0  Dietary changes: no    Health status changes: no    Bleeding/hemorrhagic complications: no    Recent/future hospitalizations: no    Any changes in medication regimen? no    Recent/future dental: no  Any missed doses?: no       Is patient compliant with meds? yes       Allergies: 1)  ! * Flu Vaccination  Anticoagulation Management History:      The patient is taking warfarin and comes in today for a routine follow up visit.  Positive risk factors for bleeding include an age of 75 years or older and history of CVA/TIA.  Negative risk factors for bleeding include no history of GI bleeding and absence of serious comorbidities.  The bleeding index is 'intermediate risk'.  Positive CHADS2 values include History of HTN, Age > 39 years old, and Prior Stroke/CVA/TIA.  Negative CHADS2 values include History of CHF and History of Diabetes.  Her last INR was 0.9.  Anticoagulation responsible provider: Daleen Squibb Young, Maisie Fus.  INR POC: 2.9.  Cuvette Lot#: [id number].  Exp: 03/2011.    Anticoagulation Management Assessment/Plan:      The patient's current anticoagulation dose is Coumadin 5 mg tabs: Take 1 tablet by mouth once daily as directed by [facility].  The target INR is 2.0-3.0.  The next INR is due 04/15/2010.  Anticoagulation instructions were given to patient.  Results were reviewed/authorized by [name] Hey, RN.  She was notified by [name] Hey RN.         Prior Anticoagulation Instructions: INR 2.5 Continue coumadin 5mg  once daily except 2.5mg  on Tuesdays and Fridays  Current Anticoagulation Instructions: INR 2.9 Continue coumadin 5mg  once daily except 2.5mg  on Tuesdays and Fridays

## 2010-09-03 NOTE — Medication Information (Signed)
Summary: ccr-lr  Anticoagulant Therapy  Managed by: Vashti Hey, RN PCP: Dr. Carylon Perches Supervising MD: Diona Browner MD, Remi Deter Indication 1: Atrial Fibrillation Lab Used: [facility] [place] Site: [place] INR POC 2.1 INR RANGE 2.0-3.0  Dietary changes: no    Health status changes: no    Bleeding/hemorrhagic complications: no    Recent/future hospitalizations: no    Any changes in medication regimen? no    Recent/future dental: no  Any missed doses?: no       Is patient compliant with meds? yes       Allergies: 1)  ! * Flu Vaccination  Anticoagulation Management History:      The patient is taking warfarin and comes in today for a routine follow up visit.  Positive risk factors for bleeding include an age of 75 years or older and history of CVA/TIA.  Negative risk factors for bleeding include no history of GI bleeding and absence of serious comorbidities.  The bleeding index is 'intermediate risk'.  Positive CHADS2 values include History of HTN, Age > 63 years old, and Prior Stroke/CVA/TIA.  Negative CHADS2 values include History of CHF and History of Diabetes.  Her last INR was 0.9.  Anticoagulation responsible provider: Diona Browner MD, Remi Deter.  INR POC: 2.1.  Cuvette Lot#: [id number].  Exp: 03/2011.    Anticoagulation Management Assessment/Plan:      The patient's current anticoagulation dose is Coumadin 5 mg tabs: Take 1 tablet by mouth once daily as directed by [facility].  The target INR is 2.0-3.0.  The next INR is due 07/01/2010.  Anticoagulation instructions were given to patient.  Results were reviewed/authorized by Vashti Hey, RN.  She was notified by Vashti Hey RN.         Prior Anticoagulation Instructions: INR 3.6 Hold coumadin tonight then resume 1 tablet once daily except 1/2 tablet on Tuesdays and Fridays  Current Anticoagulation Instructions: INR 2.1 Continue coumadin 5mg  once daily except 2.5mg  on Tuesdays and Fridays

## 2010-09-03 NOTE — Medication Information (Signed)
Summary: ccr-lr  Anticoagulant Therapy  Managed by: Vashti Hey, RN PCP: Dr. Carylon Perches Supervising MD: Diona Browner MD, Remi Deter Indication 1: Atrial Fibrillation Lab Used: [facility] [place] Site: [place] INR POC 2.9 INR RANGE 2.0-3.0  Dietary changes: no    Health status changes: no    Bleeding/hemorrhagic complications: no    Recent/future hospitalizations: no    Any changes in medication regimen? no    Recent/future dental: no  Any missed doses?: no       Is patient compliant with meds? yes       Allergies: 1)  ! * Flu Vaccination  Anticoagulation Management History:      The patient is taking warfarin and comes in today for a routine follow up visit.  Positive risk factors for bleeding include an age of 31 years or older and history of CVA/TIA.  Negative risk factors for bleeding include no history of GI bleeding and absence of serious comorbidities.  The bleeding index is 'intermediate risk'.  Positive CHADS2 values include History of HTN, Age > 16 years old, and Prior Stroke/CVA/TIA.  Negative CHADS2 values include History of CHF and History of Diabetes.  Her last INR was 0.9.  Anticoagulation responsible provider: Diona Browner MD, Remi Deter.  INR POC: 2.9.  Cuvette Lot#: [id number].  Exp: 03/2011.    Anticoagulation Management Assessment/Plan:      The patient's current anticoagulation dose is Coumadin 5 mg tabs: Take 1 tablet by mouth once daily as directed by [facility].  The target INR is 2.0-3.0.  The next INR is due 07/22/2010.  Anticoagulation instructions were given to patient.  Results were reviewed/authorized by Vashti Hey, RN.  She was notified by Vashti Hey RN.         Prior Anticoagulation Instructions: INR 2.1 Continue coumadin 5mg  once daily except 2.5mg  on Tuesdays and Fridays  Current Anticoagulation Instructions: INR 2.9 Continue coumadin 5mg  once daily except 2.5mg  on Tuesdays and Fridays

## 2010-09-03 NOTE — Assessment & Plan Note (Signed)
Summary: 3 mth f/u per checkout on 07/17/09/tg   Visit Type:  Follow-up Primary Provider:  Dr. Carylon Perches   History of Present Illness: Return visit for this very pleasant octogenarian with hypertension and atrial arrhythmias.  Since her last visit, she has done quite well.  She remains active without dyspnea or chest discomfort.  She notes no palpitations.  She has had no edema.  Her medical regime has not changed; however, she notes the recent onset of loose stools, which she attributes to diltiazem.  Anticoagulation has been stable and therapeutic.  Current Medications (verified): 1)  Toprol Xl 100 Mg Xr24h-Tab (Metoprolol Succinate) .... Take 1 Tablet By Mouth By Mouth Once Daily 2)  Caltrate 600+d Plus 600-400 Mg-Unit Tabs (Calcium Carbonate-Vit D-Min) .... Take 1 Tab Daily 3)  Clonazepam 0.5 Mg Tabs (Clonazepam) .... Tak 1/2 Tab At Bed Time 4)  Chlorthalidone 25 Mg Tabs (Chlorthalidone) .... Take 1 Tab Daily 5)  Aspir-Low 81 Mg Tbec (Aspirin) .... Take 1 Tab Daily 6)  Vitamin B-12 2500 Mcg Subl (Cyanocobalamin) .... Injection 1 Time Monthly 7)  Coumadin 5 Mg Tabs (Warfarin Sodium) .... Take 1 Tablet By Mouth Once Daily As Directed By [facility]madin Clinic 8)  Verapamil Hcl Cr 180 Mg Xr24h-Cap (Verapamil Hcl) .... Take One Capsule By Mouth Daily  Allergies (verified): 1)  ! * Flu Vaccination  Past History:  PMH, FH, and Social History reviewed and updated.  Past Surgical History: Tonsillectomy Left and right hip arthroplasty-1997 D & C-1960 Bilateral cataract extraction Surgical repair of Zenker's diverticulum at [facility] in 2006  Review of Systems       See history of present illness.  Vital Signs:  Patient profile:   77 year old female Weight:      101 pounds Pulse rate:   57 / minute BP sitting:   123 / 68  (right arm)  Vitals Entered By: Dreama Saa, CNA (October 15, 2009 2:35 PM)  Physical Exam  General:   General-Well-developed; no acute distress:thin Neck-No JVD;  no carotid bruits:surgical scar over the left neck Lungs-No tachypnea, clear without rales, rhonchi or wheezes: Thorax-mild kyphosis CV-normal PMI; normal S1 and S2: Abdomen-BS normal; soft and non-tender without masses or organomegaly; mildly distended MS-No deformities, cyanosis or clubbing: Skin- Warm, no sig. lesions: Extremities-Nl distal pulses; no edema    EKG  Procedure date:  10/15/2009  Findings:      Rhythm Strip  Atrial flutter with controlled ventricular rate Ventricular rate of 65 bpm IVCD  [place] Bing, M.D.    Impression & Recommendations:  Problem # 1:  ATRIAL FLUTTER (ICD-427.32) Control of heart rate is excellent at present; however, she is not tolerating diltiazem due to perceived GI adverse effects.  Although verapamil can cause the opposite problem, we will proceed to try that drug at the same initial dose of 180 mg q.d.  I am still inclined to pursue a rate control strategy.  Problem # 2:  HYPERTENSION (ICD-401.9) Blood pressure control is adequate at present; current medications will be continued.  I will plan to see this nice woman again in 9 months.  Other Orders: Hemoccult Cards (Take Home) (Hemoccult Cards) T-Comprehensive Metabolic Panel [phone]) T-CBC w/Diff [phone])  Patient Instructions: 1)  Your physician recommends that you schedule a follow-up appointment in: 9 months 2)  Your physician recommends that you return for lab work in: today 3)  Your physician has recommended you make the following change in your medication: stop diltiazem, and start verapamil cr  180mg  daily Prescriptions: VERAPAMIL HCL CR 180 MG XR24H-CAP (VERAPAMIL HCL) Take one capsule by mouth daily  #30 x 6   Entered by:   Teressa Lower RN   Authorized by:   Kathlen Brunswick, MD, [facility]   Signed by:   Teressa Lower RN on 10/15/2009   Method used:   Electronically to        [facility] (retail)       [address]       [place], [place]  [address]       Ph: [phone]       Fax: [phone]   RxID:   [id number]

## 2010-09-05 NOTE — Letter (Signed)
Summary: BP LOG  BP LOG   Imported By: Faythe Ghee 08/26/2010 13:53:59  _____________________________________________________________________  External Attachment:    Type:   Image     Comment:   External Document

## 2010-09-05 NOTE — Medication Information (Signed)
Summary: ccr-lr  Anticoagulant Therapy  Managed by: Vashti Hey, RN PCP: Dr. Carylon Perches Supervising MD: Dietrich Pates MD, Molly Maduro Indication 1: Atrial Fibrillation Lab Used: [facility] [place] Site:  INR POC 3.2 INR RANGE 2.0-3.0  Dietary changes: no    Health status changes: no    Bleeding/hemorrhagic complications: no    Recent/future hospitalizations: no    Any changes in medication regimen? no    Recent/future dental: no  Any missed doses?: no       Is patient compliant with meds? yes       Allergies: 1)  ! * Flu Vaccination  Anticoagulation Management History:      The patient is taking warfarin and comes in today for a routine follow up visit.  Positive risk factors for bleeding include an age of 75 years or older and history of CVA/TIA.  Negative risk factors for bleeding include no history of GI bleeding and absence of serious comorbidities.  The bleeding index is 'intermediate risk'.  Positive CHADS2 values include History of HTN, Age > 39 years old, and Prior Stroke/CVA/TIA.  Negative CHADS2 values include History of CHF and History of Diabetes.  Her last INR was 0.9.  Anticoagulation responsible Maikayla Beggs: Dietrich Pates MD, Molly Maduro.  INR POC: 3.2.  Cuvette Lot#: [id number].  Exp: 03/2011.    Anticoagulation Management Assessment/Plan:      The patient's current anticoagulation dose is Coumadin 5 mg tabs: Take 1 tablet by mouth once daily as directed by [facility].  The target INR is 2.0-3.0.  The next INR is due 08/19/2010.  Anticoagulation instructions were given to patient.  Results were reviewed/authorized by Vashti Hey, RN.  She was notified by Vashti Hey RN.         Prior Anticoagulation Instructions: INR 2.9 Continue coumadin 5mg  once daily except 2.5mg  on Tuesdays and Fridays  Current Anticoagulation Instructions: INR 3.2 Take coumadin 1/2 tablet tonight then resume 5mg  once daily except 2.5mg  on Tuesdays and Fridays

## 2010-09-05 NOTE — Medication Information (Signed)
Summary: [facility]  Anticoagulant Therapy  Managed by: Inactive PCP: Dr. Carylon Perches Supervising MD: Dietrich Pates MD, Molly Maduro Indication 1: Atrial Fibrillation Lab Used: [facility] [place] Site:  INR RANGE 2.0-3.0          Comments: coumadin was stopped today and pt was started on Pradaxa.  Allergies: 1)  ! * Flu Vaccination  Anticoagulation Management History:      Positive risk factors for bleeding include an age of 75 years or older and history of CVA/TIA.  Negative risk factors for bleeding include no history of GI bleeding and absence of serious comorbidities.  The bleeding index is 'intermediate risk'.  Positive CHADS2 values include History of HTN, Age > 72 years old, and Prior Stroke/CVA/TIA.  Negative CHADS2 values include History of CHF and History of Diabetes.  Her last INR was 0.9.  Anticoagulation responsible provider: Dietrich Pates MD, Molly Maduro.  Exp: 03/2011.    Anticoagulation Management Assessment/Plan:      The patient's current anticoagulation dose is Coumadin 5 mg tabs: Take 1 tablet by mouth once daily as directed by [facility].  The target INR is 2.0-3.0.  The next INR is due 08/19/2010.  Anticoagulation instructions were given to patient.  Results were reviewed/authorized by Inactive.         Prior Anticoagulation Instructions: INR 3.2 Take coumadin 1/2 tablet tonight then resume 5mg  once daily except 2.5mg  on Tuesdays and Fridays

## 2010-09-05 NOTE — Assessment & Plan Note (Signed)
Summary: 1 MTH BP CHECK PER CHECKOUT ON 07/22/10/TG  Nurse Visit   Vital Signs:  Patient profile:   77 year old female Weight:      100 pounds Pulse rate:   67 / minute BP sitting:   138 / 80  Vitals Entered ByLarita Fife Via LPN (August 26, 2010 9:11 AM)  Primary Provider:  Dr. Carylon Perches   History of Present Illness: S: Pt. arrives in office for 1 month BP check with nurse. B: On last OV with Joni Reining, NP on 07-22-10 pt's BP was mildly elevated at 151/81 with a pulse of 61, pt. was advised to monitor BP and have BP check with nurse today. A:  Pt. has no cardiac complaints at this time. She brought in her medications (she is taking as directed) and BP diary (scanned into chart). She wants to know if she should continue to take ASA 81mg  once daily now that she is taking Pradaxa. Pt. states that the information sheet that came with Pradaxa stated not to take asa while on Pradaxa. Please advise.  R: We will contact pt. with Joni Reining, NP's recommendations.   Current Medications (verified): 1)  Metoprolol Succinate 100 Mg Xr24h-Tab (Metoprolol Succinate) .... Take One Tablet By Mouth Daily 2)  Caltrate 600+d Plus 600-400 Mg-Unit Tabs (Calcium Carbonate-Vit D-Min) .... Take 1 Tab Daily 3)  Clonazepam 0.5 Mg Tabs (Clonazepam) .... Tak 1/2 Tab At Bed Time 4)  Chlorthalidone 25 Mg Tabs (Chlorthalidone) .... Take 1 Tab Daily 5)  Aspir-Low 81 Mg Tbec (Aspirin) .... Take 1 Tab Daily 6)  Vitamin B-12 2500 Mcg Subl (Cyanocobalamin) .... Injection 1 Time Monthly 7)  Verapamil Hcl Cr 180 Mg Xr24h-Cap (Verapamil Hcl) .... Take One Capsule By Mouth Daily 8)  Pradaxa 150 Mg Caps (Dabigatran Etexilate Mesylate) .... Take 1 Tablet By Mouth Two Times A Day  Allergies (verified): 1)  ! * Flu Vaccination NO changes. BP well controlled on current medications.  Joni Reining NP   Pt. advised and states she understands instructions given.       Larita Fife Via LPN  August 26, 2010 3:34 PM

## 2010-09-05 NOTE — Letter (Signed)
Summary: [place] [facility], [facility] at [facility]  [address] [place] [address]   Phone: [phone]  Fax: [phone]     July 22, 2010 MRN: [id number]   JALAYNE GANESH [address] [place], [place]  [address]      YOUR LAB WORK IS DUE   October 21, 2010  Please go to [facility], located across the street from [facility] on the second floor.  Hours are Monday - Friday 7am until 7:30pm         Saturday 8am until 12noon      _X_ YOUR LABWORK IS NOT FASTING --YOU MAY EAT PRIOR TO LABWORK

## 2010-09-05 NOTE — Assessment & Plan Note (Signed)
Summary: 9 mth f/u per checkout on 10/15/09/tg   Visit Type:  Follow-up Primary Provider:  Dr. Carylon Perches   History of Present Illness: [name] Young is a 77 y/o CF we are following for continued assessment and treatment of atrial flutter and hypertension.  She is without complaints today with the exception of bruising from coumadin.  She would like to change to pradaxa as she was made aware of this by her pharamcist when she complained of the bruising.  She also wishes to stop PT INR visits.  She is very hard of hearing and is difficult to have a conversation with, but she is oriented and atuned to her health status.  She takes her BP every day on her home monitor but does not remember the readings.  Preventive Screening-Counseling & Management  Alcohol-Tobacco     Alcohol drinks/day: 0     Smoking Status: never  Current Medications (verified): 1)  Metoprolol Succinate 100 Mg Xr24h-Tab (Metoprolol Succinate) .... Take One Tablet By Mouth Daily 2)  Caltrate 600+d Plus 600-400 Mg-Unit Tabs (Calcium Carbonate-Vit D-Min) .... Take 1 Tab Daily 3)  Clonazepam 0.5 Mg Tabs (Clonazepam) .... Tak 1/2 Tab At Bed Time 4)  Chlorthalidone 25 Mg Tabs (Chlorthalidone) .... Take 1 Tab Daily 5)  Aspir-Low 81 Mg Tbec (Aspirin) .... Take 1 Tab Daily 6)  Vitamin B-12 2500 Mcg Subl (Cyanocobalamin) .... Injection 1 Time Monthly 7)  Verapamil Hcl Cr 180 Mg Xr24h-Cap (Verapamil Hcl) .... Take One Capsule By Mouth Daily 8)  Pradaxa 150 Mg Caps (Dabigatran Etexilate Mesylate) .... Take 1 Tablet By Mouth Two Times A Day  Allergies (verified): 1)  ! * Flu Vaccination  Comments:  Nurse/Medical Assistant: patient brought meds she uses the drug store in [place] we reviewed meds they are all the same from previous ov  Past History:  Past medical, surgical, family and social histories (including risk factors) reviewed, and no changes noted (except as noted below).  Past Medical History: Reviewed history from  05/01/2009 and no changes required. Atrial flutter-diagnosed in 04/2009 HYPERTENSION (ICD-401.9) Osteoporosis Zenker's diverticulum-surgical repair at [facility] in 2006 Tobacco abuse--40 pack year history discontinued in 1992 CVA or transient ischemic attack at the time of hip surgery  Past Surgical History: Reviewed history from 10/15/2009 and no changes required. Tonsillectomy Left and right hip arthroplasty-1997 D & C-1960 Bilateral cataract extraction Surgical repair of Zenker's diverticulum at [facility] in 2006  Family History: Reviewed history from 04/26/2009 and no changes required. Father:deceased age 42 pneumonia Mother:deceased age 49 natural causes Siblings:2 brothers alive and well  Social History: Reviewed history from 04/26/2009 and no changes required. Retired  Widowed  Tobacco Use - Former.  Alcohol Use - no Regular Exercise - yes Drug Use - no Alcohol drinks/day:  0 Smoking Status:  never  Review of Systems       excessive bruising on coumadin.  All other systems have been reviewed and are negative unless stated above.   Vital Signs:  Patient profile:   77 year old female Weight:      98 pounds BMI:     17.42 O2 Sat:      94 % on Room air Pulse rate:   61 / minute BP sitting:   151 / 81  (right arm)  Vitals Entered By: Dreama Saa, CNA (July 22, 2010 11:22 AM)  O2 Flow:  Room air  Physical Exam  General:  normal appearance.   Lungs:  Prolonged expiratory effort Heart:  Distant heart  sounds with irregulary regular rate. Abdomen:  Bowel sounds positive; abdomen soft and non-tender without masses, organomegaly, or hernias noted. No hepatosplenomegaly. Msk:  Kyphosis Pulses:  pulses normal in all 4 extremities Extremities:  trace right pedal edema.   Neurologic:  VERY hard of hearing. Skin:  Some ecchymosis, very small on arms.   Impression & Recommendations:  Problem # 1:  COUMADIN THERAPY (ICD-V58.61) Seh wishes to stop taking  coumadin and begin Pradaxa.  I spoke with [employer] and after a lengthy wait, I found that this is covered with 30% co-pay.  She will pay $61 a month. This was explained to her and she says its about what she pays for the frequent INR checks and coumadin at her pharmacy. We have discussed that she can still have some bruising on this medication.  She is willing to change.  I have provided her with a Rx and some samples of pradaxa. She will begin right away and stop her coumadin immediately.    Problem # 2:  ATRIAL FLUTTER (ICD-427.32) HR is controlled for the most part.  She is asymptomatic. The following medications were removed from the medication list:    Coumadin 5 Mg Tabs (Warfarin sodium) .Marland Kitchen... Take 1 tablet by mouth once daily as directed by coumadin clinic Her updated medication list for this problem includes:    Metoprolol Succinate 100 Mg Xr24h-tab (Metoprolol succinate) .Marland Kitchen... Take one tablet by mouth daily    Aspir-low 81 Mg Tbec (Aspirin) .Marland Kitchen... Take 1 tab daily  Problem # 3:  HYPERTENSION (ICD-401.9) Mildly elevated today. We have asked her to right down her BP readings at home and have a follow-up nurse check to have BP checked in the office and to review her readings at home.  She verbalizes understanding. Her updated medication list for this problem includes:    Metoprolol Succinate 100 Mg Xr24h-tab (Metoprolol succinate) .Marland Kitchen... Take one tablet by mouth daily    Chlorthalidone 25 Mg Tabs (Chlorthalidone) .Marland Kitchen... Take 1 tab daily    Aspir-low 81 Mg Tbec (Aspirin) .Marland Kitchen... Take 1 tab daily    Verapamil Hcl Cr 180 Mg Xr24h-cap (Verapamil hcl) .Marland Kitchen... Take one capsule by mouth daily  Other Orders: Future Orders: T-CBC w/Diff ([id number]) ... 10/21/2010  Patient Instructions: 1)  Your physician recommends that you schedule a follow-up appointment in: 6 months 2)  Your physician recommends that you return for lab work in: 3 months 3)  Your physician has recommended you make the following change  in your medication: pradaxa 150mg  two times a day, stop warfarin 4)  You have been referred to nurse visit in 1 month, please bring bp diary 5)  Your physician has requested that you regularly monitor and record your blood pressure readings at home.  Please use the same machine at the same time of day to check your readings and record them to bring to your follow-up visit. Prescriptions: PRADAXA 150 MG CAPS (DABIGATRAN ETEXILATE MESYLATE) Take 1 tablet by mouth two times a day  #60 x 3   Entered by:   Teressa Lower RN   Authorized by:   Joni Reining, NP   Signed by:   Teressa Lower RN on 07/22/2010   Method used:   Electronically to        [facility] (retail)       [address]       [place], [place]  [address]  Ph: [phone]       Fax: [phone]   RxID:   [id number]

## 2010-11-19 ENCOUNTER — Ambulatory Visit (INDEPENDENT_AMBULATORY_CARE_PROVIDER_SITE_OTHER): Payer: Medicare Other | Admitting: Urology

## 2010-11-19 DIAGNOSIS — R3129 Other microscopic hematuria: Secondary | ICD-10-CM

## 2010-11-21 ENCOUNTER — Telehealth: Payer: Self-pay | Admitting: Cardiology

## 2010-11-21 ENCOUNTER — Other Ambulatory Visit: Payer: Self-pay | Admitting: *Deleted

## 2010-11-21 MED ORDER — DABIGATRAN ETEXILATE MESYLATE 150 MG PO CAPS: 150.0000 mg | ORAL_CAPSULE | Freq: Two times a day (BID) | ORAL | Status: DC

## 2010-11-21 NOTE — Telephone Encounter (Signed)
RX WAS FAXED 11/20/10 FOR PATIENT PRADAXA TO BE FILLED SHE IS ON LAST PILL TODAY

## 2010-11-21 NOTE — Telephone Encounter (Signed)
Refill called in. 

## 2010-11-22 ENCOUNTER — Other Ambulatory Visit: Payer: Self-pay

## 2010-11-24 NOTE — Telephone Encounter (Signed)
Medication already refilled.

## 2010-12-20 NOTE — Procedures (Signed)
NAME:  [name] Young, [name] Young                       ACCOUNT NO.:  [id number]   MEDICAL RECORD NO.:  [id number]                   PATIENT TYPE:  OUT   LOCATION:  RAD                                  FACILITY:  [facility]   PHYSICIAN:  Kittanning Bing, M.D.               DATE OF BIRTH:  10-04-1926   DATE OF PROCEDURE:  03/06/2004  DATE OF DISCHARGE:                                  ECHOCARDIOGRAM   REFERRING PHYSICIAN:  Please call Karena Addison in the echo lab and obtain the name  of the physician from her.   INDICATIONS:  A 77 year old woman with hypertension and palpitations.   Aorta 3.1, left atrium 4, septum 1.4, posterior wall 1.1, LV diastole 4.2,  LV systole 2.8.   IMPRESSION:  1. Technically adequate echocardiographic study.  2. Mild left atrial enlargement;  normal right atrial size.  Normal right     ventricular size and function.  3. Minimal aortic valvular sclerosis;  minimal annular calcification.  4. Normal mitral valve;  flat coaptation without definite prolapse;  very     mild mitral annular calcification.  5. Normal tricuspid and pulmonic valves.  6. Normal pulmonary artery.  7. Normal internal dimension of the left ventricle;  mild concentric left     ventricular hypertrophy.  Normal regional and global left ventricular     systolic function.  8. Normal IVC.      ___________________________________________                                             Bing, M.D.   RR/MEDQ  D:  03/06/2004  T:  03/06/2004  Job:  [id number]

## 2010-12-20 NOTE — Op Note (Signed)
NAME:  [name] Young, [name] Young                       ACCOUNT NO.:  [id number]   MEDICAL RECORD NO.:  [id number]                   PATIENT TYPE:  OIB   LOCATION:  2550                                 FACILITY:  [facility]   PHYSICIAN:  Zola Button T. Lazarus Salines, M.D.              DATE OF BIRTH:  [birth year]   DATE OF PROCEDURE:  09/08/2002  DATE OF DISCHARGE:                                 OPERATIVE REPORT   PREOPERATIVE DIAGNOSIS:  Zenker's esophageal diverticulum with dysphagia.   POSTOPERATIVE DIAGNOSIS:  Zenker's esophageal diverticulum with dysphagia.   PROCEDURE:  Direct laryngoscopy, esophagoscopy, endoscopic diverticulotomy.   SURGEON:  Gloris Manchester. Lazarus Salines, M.D.   ANESTHESIA:  General orotracheal.   ESTIMATED BLOOD LOSS:  Minimal.   COMPLICATIONS:  None.   FINDINGS:  Normal larynx.  A broad-mouthed diverticulum pocket with the  cricopharyngeus band in the typical location.  The esophagus easily entered  with some foamy presumed reflux contents.  Slight esophageal erosion.   DESCRIPTION OF PROCEDURE:  With the patient in a comfortable supine  position, general orotracheal anesthesia was induced without difficulty.  At  an appropriate level, the table was turned 90 degrees and the patient placed  in a slight reverse Trendelenburg.  The rubber tooth guard was applied.  Taking care to protect lips, teeth, and endotracheal tube, the Holinger  laryngoscope was introduced and laryngoscopy was performed.  The findings  were as described above.  The laryngoscope was removed.  The cervical  esophagoscope was lubricated, passed into the hypopharynx, and into the  esophagus under direct vision without difficulty.  This was inserted to its  full length with the findings as described above.  The esophagoscope was  removed.  At this point, the Weerda esophagoscope was introduced and  expanded for visualization.  The findings were as described above.  The  [facility] dilator was placed into the esophageal  lumen for orientation  purposes without difficulty.  The diverticulum pouch had no food contents  and did have only very mild mucositis.  The endoscopic stapling device was  inverted and placed with the long jaw down the cervical esophagus and the  short jaw into the diverticular pouch.  The [facility] dilator was removed.  Under endoscopic vision, the stapler was situated for maximal depth into the  diverticulum pocket to entrap the cricopharyngeus muscle between the jaws.  Pictures were taken both before and after.  With the instrument in the  midline in good position, it was clamped tight and the position was checked  once more.  The instrument was fired and approximately 1.5 cm of staple then  lysed.  __________ wall was identified.  There was minimal oozing.  Good  opening of the pouch was felt to have been accomplished.  The stapled  portion was relatively thin, and it was not entirely clear whether the  cricopharyngeal muscle was contained within the divided portion.  The  cervical esophagoscope was  passed easily through the opening with no other  significant findings.  Hemostasis was observed.  At this point the procedure  was completed.  The esophagoscope and the Weerda scope had all been removed  and the dental status was intact.  The patient was returned to anesthesia,  awakened, extubated, and transferred to recovery in stable condition.   COMMENT:  A 77 year old white female with a several-year history of  progressive dysphagia and endoscopic and x-ray evidence of a diverticulum  consistent with Zenker's was the indication for today's procedure.  Probable  reflux esophagitis.  No evidence of neoplasm.  Will treat the patient with  liquid analgesics and antireflux measures and check her back in the office  in two weeks.  Will observe for 23 hours for any complications, including  deep neck infection, swallowing or bleeding difficulty.                                                Gloris Manchester. Lazarus Salines, M.D.    KTW/MEDQ  D:  09/08/2002  T:  09/08/2002  Job:  [id number]   cc:   R. Roetta Sessions, M.D.  [address]  [place]  [place] [address]  Fax: [phone]   Kingsley Callander. Ouida Sills, M.D.  [address]  [place]  [place] [address]  Fax: [phone]

## 2011-01-15 ENCOUNTER — Encounter: Payer: Self-pay | Admitting: Cardiology

## 2011-01-27 ENCOUNTER — Other Ambulatory Visit: Payer: Self-pay | Admitting: Cardiology

## 2011-02-12 ENCOUNTER — Ambulatory Visit (INDEPENDENT_AMBULATORY_CARE_PROVIDER_SITE_OTHER): Payer: Medicare Other | Admitting: Cardiology

## 2011-02-12 ENCOUNTER — Encounter: Payer: Self-pay | Admitting: Cardiology

## 2011-02-12 DIAGNOSIS — I10 Essential (primary) hypertension: Secondary | ICD-10-CM

## 2011-02-12 DIAGNOSIS — G459 Transient cerebral ischemic attack, unspecified: Secondary | ICD-10-CM

## 2011-02-12 DIAGNOSIS — I4891 Unspecified atrial fibrillation: Secondary | ICD-10-CM

## 2011-02-12 DIAGNOSIS — Z7901 Long term (current) use of anticoagulants: Secondary | ICD-10-CM

## 2011-02-12 DIAGNOSIS — Z720 Tobacco use: Secondary | ICD-10-CM | POA: Insufficient documentation

## 2011-02-12 MED ORDER — VERAPAMIL HCL ER 180 MG PO CP24: 180.0000 mg | ORAL_CAPSULE | Freq: Every day | ORAL | Status: DC

## 2011-02-12 NOTE — Patient Instructions (Signed)
Your physician recommends that you schedule a follow-up appointment in: 9 months Your physician recommends that you return for lab work OZ:[id number] Your physician has recommended you make the following change in your medication: stop aspirin Stool cards- follow directions in packet

## 2011-02-12 NOTE — Assessment & Plan Note (Signed)
Blood pressure control is excellent; current medications will be continued. 

## 2011-02-12 NOTE — Assessment & Plan Note (Signed)
Heart rate control is good.  I see no indication for aspirin to be taken concomitantly with dabigatran, and she will continue on the anticoagulant alone.

## 2011-02-12 NOTE — Progress Notes (Signed)
HPI : [name] returns to the office as scheduled for continued assessment and treatment of atrial arrhythmias and hypertension.  She has been maintained on anticoagulation with dabigatran without significant adverse effects.  She has mild bruising.  She noted instructions accompanying the product indicating that she should not take with aspirin.  Accordingly, she has been taking aspirin only every other day.  She denies any blood loss, blood pressure out of control, palpitations, orthopnea, PND, exertional dyspnea or chest discomfort.  A list of home blood pressure determinations were reviewed.  A few systolics are between 140 and 146, but otherwise both systolics and diastolics are normal.  Current Outpatient Prescriptions on File Prior to Visit  Medication Sig Dispense Refill  . Calcium Carbonate-Vitamin D (CALTRATE 600+D) 600-400 MG-UNIT per tablet Take 1 tablet by mouth daily.        . chlorthalidone (HYGROTON) 25 MG tablet Take 25 mg by mouth daily.        . clonazePAM (KLONOPIN) 0.5 MG tablet Take 0.5 mg by mouth at bedtime. Take 1/2 tab       . Cyanocobalamin (VITAMIN B-12) 2500 MCG SUBL Place under the tongue. Injection 1 time monthly         . dabigatran (PRADAXA) 150 MG CAPS Take 1 capsule (150 mg total) by mouth every 12 (twelve) hours.  60 capsule  3  . metoprolol (TOPROL-XL) 100 MG 24 hr tablet TAKE 1 TABLET DAILY  30 tablet  3  . DISCONTD: verapamil (VERELAN PM) 180 MG 24 hr capsule Take 180 mg by mouth daily.           No Known Allergies    Past medical history, social history, and family history reviewed and updated.  ROS: See history of present illness.  PHYSICAL EXAM: BP 148/73  Pulse 53  Ht 5' (1.524 m)  Wt 43.545 kg (96 lb)  BMI 18.75 kg/m2  SpO2 94%  General-Well developed; no acute distress Body habitus-thin Neck-No JVD; no carotid bruits Lungs-clear lung fields; resonant to percussion; mild kyphosis Cardiovascular-normal PMI; normal S1 and  S2 Abdomen-normal bowel sounds; soft and non-tender without masses or organomegaly Musculoskeletal-No deformities, no cyanosis or clubbing Neurologic-Normal cranial nerves; symmetric strength and tone Skin-Warm, no significant lesions Extremities-distal pulses intact; mild edema  ASSESSMENT AND PLAN:

## 2011-02-12 NOTE — Assessment & Plan Note (Signed)
Patient has done well with current anticoagulant therapy.  Aspirin will be discontinued as noted above.  CBC and stool for Hemoccult testing will be obtained to exclude occult GI blood loss.  PTT will be measured to verify adequate anticoagulant response.  If creatinine clearance is less than 30, the dose of medication will need to be reduced.  I will plan to see this very nice woman again in 9 months.

## 2011-02-13 LAB — COMPREHENSIVE METABOLIC PANEL
ALT: 16 U/L (ref 0–35)
AST: 26 U/L (ref 0–37)
CO2: 28 mEq/L (ref 19–32)
Calcium: 10.3 mg/dL (ref 8.4–10.5)
Chloride: 91 mEq/L — ABNORMAL LOW (ref 96–112)
Creat: 0.68 mg/dL (ref 0.50–1.10)
Potassium: 3.5 mEq/L (ref 3.5–5.3)
Sodium: 130 mEq/L — ABNORMAL LOW (ref 135–145)
Total Protein: 6.5 g/dL (ref 6.0–8.3)

## 2011-02-13 LAB — CBC WITH DIFFERENTIAL/PLATELET
Basophils Absolute: 0 10*3/uL (ref 0.0–0.1)
Eosinophils Relative: 1 % (ref 0–5)
Lymphocytes Relative: 18 % (ref 12–46)
Lymphs Abs: 1.2 10*3/uL (ref 0.7–4.0)
Neutro Abs: 4.6 10*3/uL (ref 1.7–7.7)
Neutrophils Relative %: 69 % (ref 43–77)
Platelets: 289 10*3/uL (ref 150–400)
RBC: 3.7 MIL/uL — ABNORMAL LOW (ref 3.87–5.11)
RDW: 12.8 % (ref 11.5–15.5)
WBC: 6.6 10*3/uL (ref 4.0–10.5)

## 2011-02-13 LAB — APTT: aPTT: 56 seconds — ABNORMAL HIGH (ref 24–37)

## 2011-02-18 ENCOUNTER — Ambulatory Visit (INDEPENDENT_AMBULATORY_CARE_PROVIDER_SITE_OTHER): Payer: Medicare Other

## 2011-02-18 ENCOUNTER — Ambulatory Visit (INDEPENDENT_AMBULATORY_CARE_PROVIDER_SITE_OTHER): Payer: Medicare Other | Admitting: Urology

## 2011-02-18 ENCOUNTER — Other Ambulatory Visit: Payer: Self-pay | Admitting: Cardiology

## 2011-02-18 DIAGNOSIS — R3129 Other microscopic hematuria: Secondary | ICD-10-CM

## 2011-02-18 DIAGNOSIS — R195 Other fecal abnormalities: Secondary | ICD-10-CM

## 2011-02-21 ENCOUNTER — Encounter: Payer: Self-pay | Admitting: Cardiology

## 2011-02-25 ENCOUNTER — Other Ambulatory Visit: Payer: Self-pay | Admitting: *Deleted

## 2011-02-25 DIAGNOSIS — Z7901 Long term (current) use of anticoagulants: Secondary | ICD-10-CM

## 2011-02-25 LAB — POC HEMOCCULT BLD/STL (HOME/3-CARD/SCREEN)
Card #3 Fecal Occult Blood, POC: NEGATIVE
Fecal Occult Blood, POC: NEGATIVE

## 2011-03-19 ENCOUNTER — Other Ambulatory Visit: Payer: Self-pay | Admitting: Cardiology

## 2011-05-22 ENCOUNTER — Encounter: Payer: Self-pay | Admitting: Cardiology

## 2011-05-26 ENCOUNTER — Other Ambulatory Visit: Payer: Self-pay | Admitting: Cardiology

## 2011-09-01 ENCOUNTER — Emergency Department (HOSPITAL_COMMUNITY): Payer: Medicare Other

## 2011-09-01 ENCOUNTER — Other Ambulatory Visit: Payer: Self-pay

## 2011-09-01 ENCOUNTER — Inpatient Hospital Stay (HOSPITAL_COMMUNITY)
Admission: EM | Admit: 2011-09-01 | Discharge: 2011-09-03 | DRG: 191 | Disposition: A | Discharge status: Home / Self Care | Payer: Medicare Other | Attending: Internal Medicine | Admitting: Internal Medicine

## 2011-09-01 ENCOUNTER — Encounter (HOSPITAL_COMMUNITY): Payer: Self-pay

## 2011-09-01 DIAGNOSIS — J441 Chronic obstructive pulmonary disease with (acute) exacerbation: Principal | ICD-10-CM | POA: Diagnosis present

## 2011-09-01 DIAGNOSIS — J988 Other specified respiratory disorders: Secondary | ICD-10-CM

## 2011-09-01 DIAGNOSIS — E876 Hypokalemia: Secondary | ICD-10-CM

## 2011-09-01 DIAGNOSIS — I4891 Unspecified atrial fibrillation: Secondary | ICD-10-CM | POA: Diagnosis present

## 2011-09-01 DIAGNOSIS — Z96649 Presence of unspecified artificial hip joint: Secondary | ICD-10-CM

## 2011-09-01 DIAGNOSIS — Z87891 Personal history of nicotine dependence: Secondary | ICD-10-CM

## 2011-09-01 DIAGNOSIS — I10 Essential (primary) hypertension: Secondary | ICD-10-CM | POA: Diagnosis present

## 2011-09-01 DIAGNOSIS — R64 Cachexia: Secondary | ICD-10-CM | POA: Diagnosis present

## 2011-09-01 DIAGNOSIS — R413 Other amnesia: Secondary | ICD-10-CM | POA: Diagnosis present

## 2011-09-01 DIAGNOSIS — Z681 Body mass index (BMI) 19 or less, adult: Secondary | ICD-10-CM

## 2011-09-01 DIAGNOSIS — I359 Nonrheumatic aortic valve disorder, unspecified: Secondary | ICD-10-CM | POA: Diagnosis present

## 2011-09-01 DIAGNOSIS — M4120 Other idiopathic scoliosis, site unspecified: Secondary | ICD-10-CM | POA: Diagnosis present

## 2011-09-01 DIAGNOSIS — E860 Dehydration: Secondary | ICD-10-CM

## 2011-09-01 LAB — URINALYSIS, ROUTINE W REFLEX MICROSCOPIC
Glucose, UA: NEGATIVE mg/dL
Hgb urine dipstick: NEGATIVE
Leukocytes, UA: NEGATIVE
Protein, ur: NEGATIVE mg/dL
Specific Gravity, Urine: 1.015 (ref 1.005–1.030)
Urobilinogen, UA: 0.2 mg/dL (ref 0.0–1.0)

## 2011-09-01 LAB — DIFFERENTIAL
Basophils Absolute: 0 10*3/uL (ref 0.0–0.1)
Lymphocytes Relative: 8 % — ABNORMAL LOW (ref 12–46)
Lymphs Abs: 1 10*3/uL (ref 0.7–4.0)
Neutro Abs: 10.4 10*3/uL — ABNORMAL HIGH (ref 1.7–7.7)
Neutrophils Relative %: 84 % — ABNORMAL HIGH (ref 43–77)

## 2011-09-01 LAB — CBC
Platelets: 345 10*3/uL (ref 150–400)
RBC: 4.35 MIL/uL (ref 3.87–5.11)
RDW: 11.9 % (ref 11.5–15.5)
WBC: 12.3 10*3/uL — ABNORMAL HIGH (ref 4.0–10.5)

## 2011-09-01 LAB — BASIC METABOLIC PANEL
CO2: 30 mEq/L (ref 19–32)
Glucose, Bld: 112 mg/dL — ABNORMAL HIGH (ref 70–99)
Potassium: 2.8 mEq/L — ABNORMAL LOW (ref 3.5–5.1)
Sodium: 133 mEq/L — ABNORMAL LOW (ref 135–145)

## 2011-09-01 MED ORDER — SODIUM CHLORIDE 0.9 % IJ SOLN
INTRAMUSCULAR | Status: AC
  Administered 2011-09-01: 10 mL
  Filled 2011-09-01: qty 3

## 2011-09-01 MED ORDER — CLONAZEPAM 0.5 MG PO TABS: 0.2500 mg | ORAL_TABLET | Freq: Two times a day (BID) | ORAL | Status: DC | PRN

## 2011-09-01 MED ORDER — METOPROLOL SUCCINATE ER 50 MG PO TB24
100.0000 mg | ORAL_TABLET | Freq: Every day | ORAL | Status: DC
  Administered 2011-09-01 – 2011-09-03 (×3): 100 mg via ORAL
  Filled 2011-09-01 (×3): qty 2

## 2011-09-01 MED ORDER — VERAPAMIL HCL ER 180 MG PO TBCR
180.0000 mg | EXTENDED_RELEASE_TABLET | Freq: Every day | ORAL | Status: DC
  Administered 2011-09-01 – 2011-09-03 (×3): 180 mg via ORAL
  Filled 2011-09-01 (×3): qty 1

## 2011-09-01 MED ORDER — BOOST / RESOURCE BREEZE PO LIQD
1.0000 | Freq: Three times a day (TID) | ORAL | Status: DC
  Administered 2011-09-01 – 2011-09-03 (×5): 1 via ORAL
  Filled 2011-09-01 (×10): qty 1

## 2011-09-01 MED ORDER — METOPROLOL SUCCINATE ER 50 MG PO TB24: 100.0000 mg | ORAL_TABLET | Freq: Every day | ORAL | Status: DC

## 2011-09-01 MED ORDER — ALBUTEROL SULFATE (5 MG/ML) 0.5% IN NEBU
2.5000 mg | INHALATION_SOLUTION | Freq: Once | RESPIRATORY_TRACT | Status: AC
  Administered 2011-09-01: 2.5 mg via RESPIRATORY_TRACT
  Filled 2011-09-01: qty 0.5

## 2011-09-01 MED ORDER — ACETAMINOPHEN 500 MG PO TABS: 500.0000 mg | ORAL_TABLET | Freq: Four times a day (QID) | ORAL | Status: DC | PRN

## 2011-09-01 MED ORDER — IPRATROPIUM BROMIDE 0.02 % IN SOLN
0.5000 mg | Freq: Four times a day (QID) | RESPIRATORY_TRACT | Status: DC
  Administered 2011-09-01 – 2011-09-03 (×4): 0.5 mg via RESPIRATORY_TRACT
  Filled 2011-09-01 (×6): qty 2.5

## 2011-09-01 MED ORDER — ALBUTEROL SULFATE (5 MG/ML) 0.5% IN NEBU
2.5000 mg | INHALATION_SOLUTION | Freq: Four times a day (QID) | RESPIRATORY_TRACT | Status: DC
  Administered 2011-09-01 – 2011-09-03 (×4): 2.5 mg via RESPIRATORY_TRACT
  Filled 2011-09-01 (×6): qty 0.5

## 2011-09-01 MED ORDER — DABIGATRAN ETEXILATE MESYLATE 150 MG PO CAPS: 150.0000 mg | ORAL_CAPSULE | Freq: Two times a day (BID) | ORAL | Status: DC

## 2011-09-01 MED ORDER — POTASSIUM CHLORIDE CRYS ER 20 MEQ PO TBCR
40.0000 meq | EXTENDED_RELEASE_TABLET | Freq: Three times a day (TID) | ORAL | Status: AC
  Administered 2011-09-01 – 2011-09-02 (×5): 40 meq via ORAL
  Filled 2011-09-01 (×5): qty 2

## 2011-09-01 MED ORDER — POTASSIUM CHLORIDE CRYS ER 20 MEQ PO TBCR: 40.0000 meq | EXTENDED_RELEASE_TABLET | Freq: Three times a day (TID) | ORAL | Status: DC

## 2011-09-01 MED ORDER — DABIGATRAN ETEXILATE MESYLATE 150 MG PO CAPS
150.0000 mg | ORAL_CAPSULE | Freq: Two times a day (BID) | ORAL | Status: DC
  Administered 2011-09-01 – 2011-09-03 (×4): 150 mg via ORAL
  Filled 2011-09-01 (×8): qty 1

## 2011-09-01 MED ORDER — DEXTROSE 5 % IV SOLN
1.0000 g | INTRAVENOUS | Status: DC
  Administered 2011-09-01 – 2011-09-02 (×2): 1 g via INTRAVENOUS
  Filled 2011-09-01 (×3): qty 10

## 2011-09-01 MED ORDER — SODIUM CHLORIDE 0.9 % IV SOLN
INTRAVENOUS | Status: DC
  Administered 2011-09-01: 16:00:00 via INTRAVENOUS

## 2011-09-01 MED ORDER — POTASSIUM CHLORIDE 10 MEQ/100ML IV SOLN
10.0000 meq | Freq: Once | INTRAVENOUS | Status: AC
  Administered 2011-09-01: 10 meq via INTRAVENOUS
  Filled 2011-09-01: qty 100

## 2011-09-01 MED ORDER — DEXTROSE 5 % IV SOLN
500.0000 mg | INTRAVENOUS | Status: DC
  Administered 2011-09-01 – 2011-09-02 (×2): 500 mg via INTRAVENOUS
  Filled 2011-09-01 (×4): qty 500

## 2011-09-01 MED ORDER — POTASSIUM CHLORIDE CRYS ER 20 MEQ PO TBCR
40.0000 meq | EXTENDED_RELEASE_TABLET | Freq: Once | ORAL | Status: AC
  Administered 2011-09-01: 40 meq via ORAL
  Filled 2011-09-01: qty 2

## 2011-09-01 NOTE — ED Provider Notes (Signed)
History    Scribed for Dione Booze, MD, the patient was seen in room APA04/APA04. This chart was scribed by Katha Cabal.  CSN: [id number]  Arrival date & time 09/01/11  0920   First MD Initiated Contact with Patient 09/01/11 [phone]      Chief Complaint  Patient presents with  . Weakness  . Cough    (Consider location/radiation/quality/duration/timing/severity/associated sxs/prior treatment) Patient is a 78 y.o. female presenting with weakness and cough. The history is provided by the patient and a relative. No language interpreter was used.  Weakness Primary symptoms do not include nausea. Primary symptoms comment: confusion  Additional symptoms include weakness (generalized). Medical issues also include hypertension. Associated medical issues comments: Atrial fibrillation, TIA.  Cough This is a new problem. Episode onset: about a week ago. The problem occurs every few minutes. The problem has been gradually worsening. There has been no fever. Associated symptoms include chest pain (intermittently ), chills and shortness of breath. Associated symptoms comments: No nausea, no diarrhea. Treatments tried: acetomenophen  She is not a smoker.  Patient did not get a flu shot this year as she is allergic.   Patient accompanied to ED by niece who reports that the patient has been confused.     Her niece states that she was called by the patient this morning who stated she felt like she was dying. The patient is a very poor historian and is difficult to get accurate information from her. She has been sick for about a week with a cough which is mainly nonproductive and feeling generally weak and apparently got significantly worse this morning.  PCP Carylon Perches, MD, MD   Past Medical History  Diagnosis Date  . Hypertension   . Osteoporosis   . Zenker's diverticulum     surgical repair at [facility] in 2006  . Tobacco abuse     40 pack year history discontinued in 1992  . TIA (transient ischemic  attack)     Occurred post THA  . Atrial fibrillation 2005    Echocardiogram in 2005-mild LVH; minimal AF; normal EF  . Chronic anticoagulation 2005    Past Surgical History  Procedure Date  . Tonsillectomy   . Thalamotomy 1997  . Dilation and curettage, diagnostic / therapeutic 1960  . Cataract extraction, bilateral   . Zenker's diverticulectomy 2006    [facility]    Family History  Problem Relation Age of Onset  . Pneumonia Father     History  Substance Use Topics  . Smoking status: Former Games developer  . Smokeless tobacco: Never Used  . Alcohol Use: No    OB History    Grav Para Term Preterm Abortions TAB SAB Ect Mult Living                  Review of Systems  Constitutional: Positive for chills.  Respiratory: Positive for cough and shortness of breath.   Cardiovascular: Positive for chest pain (intermittently ).  Gastrointestinal: Negative for nausea and diarrhea.  Neurological: Positive for weakness (generalized).  All other systems reviewed and are negative.    Allergies  Influenza vac typ  Home Medications   No current outpatient prescriptions on file.  BP 105/69  Pulse 99  Temp(Src) 97.9 F (36.6 C) (Oral)  Resp 20  Ht 5\' 3"  (1.6 m)  Wt 93 lb (42.185 kg)  BMI 16.47 kg/m2  SpO2 95%  Physical Exam  Nursing note and vitals reviewed. Constitutional: She is oriented to person, place, and time. She  appears cachectic.  HENT:  Head: Normocephalic.  Eyes: Conjunctivae and EOM are normal.  Neck: Neck supple.  Cardiovascular: Normal rate and regular rhythm.   Pulmonary/Chest: Effort normal and breath sounds normal. No respiratory distress.  Abdominal: Soft. There is no tenderness. There is no rebound and no guarding.  Musculoskeletal: Normal range of motion. She exhibits no edema.  Neurological: She is alert and oriented to person, place, and time. No sensory deficit.       Patient slow to answer questions,   Skin: Skin is warm and dry. No rash noted.    Psychiatric: She has a normal mood and affect. Her behavior is normal.   Vital signs are normal. ED Course  Procedures (including critical care time)   DIAGNOSTIC STUDIES: Oxygen Saturation is 96% on room air, normal by my interpretation.     COORDINATION OF CARE: 9:36 AM  Physical exam complete.  Will order CXR and blood labs.      LABS / RADIOLOGY:   Labs Reviewed  CBC - Abnormal; Notable for the following:    WBC 12.3 (*)    Hemoglobin 15.6 (*)    MCV 101.1 (*)    MCH 35.9 (*)    All other components within normal limits  DIFFERENTIAL - Abnormal; Notable for the following:    Neutrophils Relative 84 (*)    Neutro Abs 10.4 (*)    Lymphocytes Relative 8 (*)    All other components within normal limits  BASIC METABOLIC PANEL - Abnormal; Notable for the following:    Sodium 133 (*)    Potassium 2.8 (*)    Chloride 88 (*)    Glucose, Bld 112 (*)    BUN 35 (*)    GFR calc non Af Amer 49 (*)    GFR calc Af Amer 57 (*)    All other components within normal limits  URINALYSIS, ROUTINE W REFLEX MICROSCOPIC - Abnormal; Notable for the following:    Ketones, ur 15 (*)    All other components within normal limits   Results for orders placed during the hospital encounter of 09/01/11  CBC      Component Value Range   WBC 12.3 (*) 4.0 - 10.5 (K/uL)   RBC 4.35  3.87 - 5.11 (MIL/uL)   Hemoglobin 15.6 (*) 12.0 - 15.0 (g/dL)   HCT 96.2  95.2 - 84.1 (%)   MCV 101.1 (*) 78.0 - 100.0 (fL)   MCH 35.9 (*) 26.0 - 34.0 (pg)   MCHC 35.5  30.0 - 36.0 (g/dL)   RDW 32.4  40.1 - 02.7 (%)   Platelets 345  150 - 400 (K/uL)  DIFFERENTIAL      Component Value Range   Neutrophils Relative 84 (*) 43 - 77 (%)   Neutro Abs 10.4 (*) 1.7 - 7.7 (K/uL)   Lymphocytes Relative 8 (*) 12 - 46 (%)   Lymphs Abs 1.0  0.7 - 4.0 (K/uL)   Monocytes Relative 7  3 - 12 (%)   Monocytes Absolute 0.9  0.1 - 1.0 (K/uL)   Eosinophils Relative 0  0 - 5 (%)   Eosinophils Absolute 0.0  0.0 - 0.7 (K/uL)    Basophils Relative 0  0 - 1 (%)   Basophils Absolute 0.0  0.0 - 0.1 (K/uL)  BASIC METABOLIC PANEL      Component Value Range   Sodium 133 (*) 135 - 145 (mEq/L)   Potassium 2.8 (*) 3.5 - 5.1 (mEq/L)   Chloride 88 (*) 96 -  112 (mEq/L)   CO2 30  19 - 32 (mEq/L)   Glucose, Bld 112 (*) 70 - 99 (mg/dL)   BUN 35 (*) 6 - 23 (mg/dL)   Creatinine, Ser 9.60  0.50 - 1.10 (mg/dL)   Calcium 45.4  8.4 - 10.5 (mg/dL)   GFR calc non Af Amer 49 (*) >90 (mL/min)   GFR calc Af Amer 57 (*) >90 (mL/min)  URINALYSIS, ROUTINE W REFLEX MICROSCOPIC      Component Value Range   Color, Urine YELLOW  YELLOW    APPearance CLEAR  CLEAR    Specific Gravity, Urine 1.015  1.005 - 1.030    pH 6.0  5.0 - 8.0    Glucose, UA NEGATIVE  NEGATIVE (mg/dL)   Hgb urine dipstick NEGATIVE  NEGATIVE    Bilirubin Urine NEGATIVE  NEGATIVE    Ketones, ur 15 (*) NEGATIVE (mg/dL)   Protein, ur NEGATIVE  NEGATIVE (mg/dL)   Urobilinogen, UA 0.2  0.0 - 1.0 (mg/dL)   Nitrite NEGATIVE  NEGATIVE    Leukocytes, UA NEGATIVE  NEGATIVE     Dg Chest Port 1 View  09/01/2011  *RADIOLOGY REPORT*  Clinical Data: Cough.  Generalized weakness.  PORTABLE CHEST - 1 VIEW 09/01/2011 1022 hours:  Comparison: None.  Findings: Cardiac silhouette mildly enlarged for the AP portable technique.  Emphysematous changes throughout both lungs with hyperinflation.  Focal oval shaped opacity overlying the right upper lobe.  Lungs otherwise clear.  No visible pleural effusions. Thoracic aorta atherosclerotic.  Thoracolumbar scoliosis convex right.  Generalized osteopenia.  IMPRESSION: Mild cardiomegaly without pulmonary edema.  COPD/emphysema. Indeterminate oval shaped opacity overlying the right upper lobe; follow-up PA and lateral chest x-ray is suggested when the patient is clinically able.  Original Report Authenticated By: Arnell Sieving, M.D.     Workup is significant for evidence of dehydration with elevated BUN relative to creatinine. Chest x-ray shows  no evidence of pneumonia and urinalysis is no evidence of UTI. She is severely hyperkalemic and potassium is given both orally and IV. Case is discussed with her PCP, Dr. Ouida Sills, who agrees to admit her to the hospital.    MDM  Respiratory tract infection which probably started out as influenza. She needs to be evaluated for possible superimposed pneumonia.     MEDICATIONS GIVEN IN THE E.D. Scheduled Meds:    . albuterol  2.5 mg Nebulization Once  . azithromycin  500 mg Intravenous Q24H  . cefTRIAXone (ROCEPHIN)  IV  1 g Intravenous Q24H  . feeding supplement  1 Container Oral TID BM  . potassium chloride  10 mEq Intravenous Once  . potassium chloride SA  40 mEq Oral Once  . potassium chloride  40 mEq Oral TID  . DISCONTD: potassium chloride  40 mEq Oral TID   Continuous Infusions:    . sodium chloride         IMPRESSION: 1. Dehydration   2. Hypokalemia   3. Respiratory tract infection       I personally performed the services described in this documentation, which was scribed in my presence. The recorded information has been reviewed and considered.   Scribe       Dione Booze, MD 09/01/11 1710

## 2011-09-01 NOTE — H&P (Signed)
[name] Young, [name] Young NO.:  [id number]  MEDICAL RECORD NO.:  [id number]  LOCATION:  A311                          FACILITY:  [facility]  PHYSICIAN:  Kingsley Callander. Ouida Sills, MD       DATE OF BIRTH:  1927-03-03  DATE OF ADMISSION:  09/01/2011 DATE OF DISCHARGE:  LH                             HISTORY & PHYSICAL   CHIEF COMPLAINT:  Cough and weakness.  HISTORY OF PRESENT ILLNESS:  This patient is an 78 year old white female from [place], who presented with cough, congestion, and weakness. She began feeling ill over a week ago, when she developed head and chest congestion, cough, and exhaustion.  She denies fever.  She has a history of COPD.  She has not smoked for years.  She has noted continued congestion to the point that she felt too ill to care for herself and called an ambulance this morning.  She was brought to the emergency room and evaluated and found to be hypokalemic and dehydrated.  She admits to eating and drinking poorly over the past week.  She has continued to take her medications including her diuretic, chlorthalidone for hypertension.  She had a borderline blood pressure initially and has been treated with IV fluids.  PAST MEDICAL HISTORY: 1. Atrial fibrillation. 2. Hypertension. 3. COPD. 4. Osteoporosis. 5. Vitamin B12 deficiency. 6. Mild aortic stenosis. 7. Insomnia. 8. Status post Zenker diverticulum repair. 9. Scoliosis. 10.Bilateral hip replacements. 11.Breast nodule excisions. 12.Tonsillectomy. 13.Tubal ligation.  MEDICATIONS: 1. Pradaxa 150 mg b.i.d. 2. Metoprolol XL 100 mg daily. 3. Verapamil ER 180 mg daily. 4. Vitamin B12 1000 mcg monthly. 5. Chlorthalidone 12.5 mg daily. 6. Klonopin 0.5 mg 1/2 to 1 tablet at bedtime p.r.n.  ALLERGIES:  INFLUENZA VACCINE.  SOCIAL HISTORY:  She is a former smoker.  She had previously been drinking a glass of wine each night, but has not done this.  Recently, she states she does not use recreational  substances.  FAMILY HISTORY:  Includes cirrhosis in her brother and dementia in her mother.  Her father died of pneumonia.  REVIEW OF SYSTEMS:  No syncope, vomiting, diarrhea, chest pain, or abdominal pain.  As noted, she has been eating and drinking poorly.  PHYSICAL EXAMINATION:  VITAL SIGNS:  Temperature 97.9, pulse 99, respirations 20, blood pressure 100/54, oxygen saturation 97% on room air. GENERAL:  Thin elderly female, in no distress. HEENT:  Eyes, nose, and pharynx are unremarkable. NECK:  Supple with no JVD or thyromegaly. LUNGS:  Bilateral rhonchi. HEART:  Irregular with a grade 1 systolic murmur. ABDOMEN:  Soft and nontender with no hepatosplenomegaly. EXTREMITIES:  Reveal normal pulses with no clubbing, or edema. NEUROLOGIC:  She has significantly diminished hearing. LYMPH NODES:  No cervical or supraclavicular enlargement. SKIN:  Warm and dry.  LABORATORY DATA:  White count 12.3, hemoglobin 15.6, platelets 345,000, 84 segs, 8 lymphs.  Sodium 133, potassium 3.8, bicarb 30, BUN 35, creatinine 1.03, glucose 112.  The urinalysis reveals 15 mg/dL of protein, but is otherwise negative.  The chest x-ray reveals mild cardiomegaly without pulmonary edema, COPD/emphysema pattern is present. There is an indeterminate appearing oval-shaped opacity overlying the right upper lobe.  This was on  a portable single-view film and a repeat PA and lateral chest x-ray was recommended by Radiology.  Her last BUN and creatinine were 18 and 1.08 on May 06, 2011.  EKG is pending.  IMPRESSION AND PLAN: 1. Dehydration.  BUN and creatinine are clearly elevated from     baseline.  Chlorthalidone will be held.  She will be hydrated with     normal saline. 2. Chronic obstructive pulmonary disease exacerbation.  She will be     treated with albuterol nebulizer treatments as well as antibiotic     therapy.  She does have a leukocytosis, but no fever currently.     She may well have had a bout  of influenza initially and now has     continued chest congestion and cough. 3. Atrial fibrillation.  Continue Pradaxa. 4. Hypertension.  Continue current therapy. 5. History of osteoporosis. 6. Vitamin B12 deficiency. 7. Mild aortic stenosis, stable. 8. Hypokalemia.  We will replace orally and recheck tomorrow. 9. Abnormal chest x-ray, repeat chest x-ray, PA and lateral, tomorrow.     Kingsley Callander. Ouida Sills, MD     ROF/MEDQ  D:  09/01/2011  T:  09/01/2011  Job:  [id number]

## 2011-09-01 NOTE — ED Notes (Signed)
Attempted to call report, nurse busy

## 2011-09-01 NOTE — Progress Notes (Signed)
INITIAL ADULT NUTRITION ASSESSMENT Date: 09/01/2011   Time: 1:54 PM Reason for Assessment: Consult for Unintentional Weight loss  ASSESSMENT: Female 78 y.o.  Family present at time of RD visit. Spoke with Patient and Family. Reported that UBW ranges between 90-100 lbs. Patient eats 3 small meals a day. Patient very thin and wasted in appearance. Patient dislikes Ensure and Carnation instant breakfast. Prefers juice. Patient states appetite and PO intake were good up until she got sick.   Dx: <principal problem not specified>  Hx:  Past Medical History  Diagnosis Date  . Hypertension   . Osteoporosis   . Zenker's diverticulum     surgical repair at [facility] in 2006  . Tobacco abuse     40 pack year history discontinued in 1992  . TIA (transient ischemic attack)     Occurred post THA  . Atrial fibrillation 2005    Echocardiogram in 2005-mild LVH; minimal AF; normal EF  . Chronic anticoagulation 2005   Related Meds:  Scheduled Meds:   . albuterol  2.5 mg Nebulization Once  . azithromycin  500 mg Intravenous Q24H  . cefTRIAXone (ROCEPHIN)  IV  1 g Intravenous Q24H  . potassium chloride  10 mEq Intravenous Once  . potassium chloride SA  40 mEq Oral Once  . potassium chloride  40 mEq Oral TID  . DISCONTD: potassium chloride  40 mEq Oral TID   Continuous Infusions:   . sodium chloride     PRN Meds:.  Ht: 5\' 3"  (160 cm)  Wt: 93 lb (42.185 kg)  Ideal Wt: 52.4 kg % Ideal Wt: 80  Usual Wt: 90-100 lbs. % Usual Wt: 100%  Body mass index is 16.47 kg/(m^2).  Food/Nutrition Related Hx: Eats 3 small meals a day  Labs:  CMP     Component Value Date/Time   NA 133* 09/01/2011 1008   K 2.8* 09/01/2011 1008   CL 88* 09/01/2011 1008   CO2 30 09/01/2011 1008   GLUCOSE 112* 09/01/2011 1008   BUN 35* 09/01/2011 1008   CREATININE 1.03 09/01/2011 1008   CREATININE 0.68 02/12/2011 1538   CALCIUM 10.3 09/01/2011 1008   PROT 6.5 02/12/2011 1538   ALBUMIN 4.2 02/12/2011 1538   AST 26  02/12/2011 1538   ALT 16 02/12/2011 1538   ALKPHOS 43 02/12/2011 1538   BILITOT 1.1 02/12/2011 1538   GFRNONAA 49* 09/01/2011 1008   GFRAA 57* 09/01/2011 1008   No intake or output data in the 24 hours ending 09/01/11 1356  Diet Order: General  Supplements/Tube Feeding: none at this time  IVF:    sodium chloride    Estimated Nutritional Needs:   Kcal: 1100-1300 Protein: 50-60 g protein Fluid: 1ml per kcal   NUTRITION DIAGNOSIS: -Inadequate oral intake (NI-2.1).  Status: Ongoing  RELATED TO: recent illness and poor appetite  AS EVIDENCE BY: patient reports of poor appetite and low BMI of 16.4  MONITORING/EVALUATION(Goals): Weight trends, Labs, PO intake 1. Promote weight gain/ Prevent further weight loss 2. PO greater than or equal to 75% at meals and supplements  EDUCATION NEEDS: -No education needs identified at this time  INTERVENTION: 1. Will provide Resource Breeze TID to increase caloric intake. Provides an additional 750 kcal and 27 g protein daily.   Dietitian [phone]  DOCUMENTATION CODES Per approved criteria  -Non-severe (moderate) malnutrition in the context of acute illness or injury    Adron Bene 09/01/2011, 1:54 PM

## 2011-09-01 NOTE — ED Notes (Signed)
Pt stated she has been sick for several weeks, w/ cold, this am she crushed her morning meds and during taking them she got choked, and has been coughing since.   She lives alone.  Family at bedside.  resp even.

## 2011-09-01 NOTE — ED Notes (Signed)
Per ems, pt c/o of "im going to die" this am, generalized weakness, and coughing since taking meds this am

## 2011-09-01 NOTE — ED Notes (Signed)
Rate of potassium changed to 2ml/hr d/t pain at site.

## 2011-09-02 ENCOUNTER — Inpatient Hospital Stay (HOSPITAL_COMMUNITY): Payer: Medicare Other

## 2011-09-02 LAB — BASIC METABOLIC PANEL
CO2: 26 mEq/L (ref 19–32)
Calcium: 9.2 mg/dL (ref 8.4–10.5)
GFR calc non Af Amer: 78 mL/min — ABNORMAL LOW (ref >90)
Glucose, Bld: 96 mg/dL (ref 70–99)
Potassium: 3.3 mEq/L — ABNORMAL LOW (ref 3.5–5.1)
Sodium: 135 mEq/L (ref 135–145)

## 2011-09-02 MED ORDER — SODIUM CHLORIDE 0.9 % IJ SOLN
INTRAMUSCULAR | Status: AC
  Filled 2011-09-02: qty 3

## 2011-09-02 MED ORDER — SODIUM CHLORIDE 0.9 % IJ SOLN
INTRAMUSCULAR | Status: AC
  Administered 2011-09-02: 10 mL
  Filled 2011-09-02: qty 6

## 2011-09-02 NOTE — Progress Notes (Signed)
CARE MANAGEMENT NOTE 09/02/2011  Patient:  FADIA, MARLAR   Account Number:  [id number]  Date Initiated:  09/02/2011  Documentation initiated by:  Rosemary Holms  Subjective/Objective Assessment:   Pt admitted for weakness/cough. PTA lives alone at home.     Action/Plan:   Spoke with pt who states she used [facility] in the past for transport only. Denies HH. Spoke to [employer] [facility] [place]. HH through them would not be covered under Medicare.   Anticipated DC Date:  09/05/2011   Anticipated DC Plan:  HOME W HOME HEALTH SERVICES      DC Planning Services  CM consult      Choice offered to / List presented to:             Status of service:  In process, will continue to follow Medicare Important Message given?   (If response is "NO", the following Medicare IM given date fields will be blank) Date Medicare IM given:   Date Additional Medicare IM given:    Discharge Disposition:    Per UR Regulation:    Comments:  09/02/11 1300 Davison Ohms Leanord Hawking RN BSN CM Frances Furbish [phone]

## 2011-09-02 NOTE — Progress Notes (Signed)
UR Chart Review Completed  

## 2011-09-02 NOTE — Progress Notes (Signed)
CSW received referral for possible ALF placement.  Full note in shadow chart.  Pt reports she manages well at home and plans to return there at d/c.  CM aware.  CSW to sign off unless further needs arise.  [name] Young

## 2011-09-02 NOTE — Progress Notes (Signed)
NAMECAYLI, ESCAJEDA NO.:  [id number]  MEDICAL RECORD NO.:  [id number]  LOCATION:  A311                          FACILITY:  [facility]  PHYSICIAN:  Kingsley Callander. Ouida Sills, MD       DATE OF BIRTH:  1926-08-06  DATE OF PROCEDURE: DATE OF DISCHARGE:                                PROGRESS NOTE   Ms. Mancias is feeling better today.  She is breathing more comfortably. She continues to have cough, but no significant sputum production.  VITAL SIGNS:  Temperature is 98.1, pulse 66, respirations 20, blood pressure 99/68, oxygen saturation is 96%. LUNGS:  No longer reveal bilateral rhonchi. HEART:  Irregularly irregular. ABDOMEN:  Soft and nontender. NEURO:  Stable.  IMPRESSION/PLAN: 1. Dehydration.  BUN and creatinine are improved to 23 and 0.71 from     35 and 1.03. 2. Hypokalemia.  Potassium has improved from 2.8 and 3.3. 3. Chronic obstructive pulmonary disease exacerbation.  Continue     nebulizers, Rocephin, and Zithromax. 4. Atrial fibrillation.  Rate is well controlled.     Kingsley Callander. Ouida Sills, MD     ROF/MEDQ  D:  09/02/2011  T:  09/02/2011  Job:  [id number]

## 2011-09-03 ENCOUNTER — Inpatient Hospital Stay (HOSPITAL_COMMUNITY): Payer: Medicare Other

## 2011-09-03 LAB — CBC
MCH: 35.8 pg — ABNORMAL HIGH (ref 26.0–34.0)
Platelets: 289 10*3/uL (ref 150–400)
RBC: 3.58 MIL/uL — ABNORMAL LOW (ref 3.87–5.11)
WBC: 7.1 10*3/uL (ref 4.0–10.5)

## 2011-09-03 LAB — BASIC METABOLIC PANEL
CO2: 27 mEq/L (ref 19–32)
Calcium: 9.4 mg/dL (ref 8.4–10.5)
GFR calc Af Amer: 89 mL/min — ABNORMAL LOW (ref >90)
Sodium: 136 mEq/L (ref 135–145)

## 2011-09-03 LAB — VITAMIN B12: Vitamin B-12: >2000 pg/mL — ABNORMAL HIGH (ref 211–911)

## 2011-09-03 LAB — TSH: TSH: 1.29 u[IU]/mL (ref 0.350–4.500)

## 2011-09-03 MED ORDER — CEFUROXIME AXETIL 250 MG PO TABS: 250.0000 mg | ORAL_TABLET | Freq: Two times a day (BID) | ORAL | Status: AC

## 2011-09-03 NOTE — Progress Notes (Signed)
Patient to be discharged home with niece

## 2011-09-03 NOTE — Discharge Summary (Signed)
NAMEDARIELLE, HANCHER NO.:  [id number]  MEDICAL RECORD NO.:  [id number]  LOCATION:                                 FACILITY:  PHYSICIAN:  Kingsley Callander. Ouida Sills, MD       DATE OF BIRTH:  Sep 04, 1926  DATE OF ADMISSION: DATE OF DISCHARGE:  LH                              DISCHARGE SUMMARY   DISCHARGE DIAGNOSES: 1. Chronic obstructive pulmonary disease exacerbation. 2. Dehydration. 3. Hypokalemia. 4. Memory loss. 5. Chronic atrial fibrillation. 6. Hypertension. 7. Vitamin B12 deficiency. 8. Mild aortic stenosis. 9. Scoliosis. 10.Status post Zenker diverticulum repair.  DISCHARGE MEDICATIONS: 1. Ceftin 250 mg b.i.d. for 5 more days. 2. Pradaxa 150 mg b.i.d. 3. Metoprolol-XL 100 mg daily. 4. Verapamil ER 180 mg daily. 5. Vitamin B12 IM monthly. 6. Chlorthalidone is being stopped.  HOSPITAL COURSE:  This patient is an 78 year old female with COPD, who presented with a one-week history of cough, congestion, generalized weakness, and diminished oral intake.  She was found to be dehydrated with a BUN and creatinine of 35 and 1.03.  She had been on low-dose chlorthalidone for hypertension, this was stopped.  The BUN and creatinine improved to 17 and 0.75 with IV hydration.  She was hypokalemic at 2.8 and improved to 3.3 then 4.6 with oral supplementation.  Her chest x-ray revealed COPD with no acute infiltrate.  She was treated with albuterol nebulizer treatments as well as ceftriaxone and azithromycin.  She is being modified to oral cefuroxime at discharge.  Her respiratory status has improved.  Her dehydration has resolved.  Her hypokalemia has resolved.  She is improved and stable for discharge on the 30th.  She has had some progressive memory loss.  She will be evaluated further with a CT scan of the brain, B12 level, and TSH.  She will be seen in followup in my office in 1 week.  Her condition at discharge is much improved.  Medications being  discontinued at this point are chlorthalidone and clonazepam.     Kingsley Callander. Ouida Sills, MD     ROF/MEDQ  D:  09/03/2011  T:  09/03/2011  Job:  [id number]

## 2011-10-16 ENCOUNTER — Ambulatory Visit (INDEPENDENT_AMBULATORY_CARE_PROVIDER_SITE_OTHER): Payer: Medicare Other | Admitting: Otolaryngology

## 2011-10-16 DIAGNOSIS — H903 Sensorineural hearing loss, bilateral: Secondary | ICD-10-CM

## 2011-11-11 ENCOUNTER — Encounter: Payer: Self-pay | Admitting: Cardiology

## 2011-11-11 ENCOUNTER — Ambulatory Visit (INDEPENDENT_AMBULATORY_CARE_PROVIDER_SITE_OTHER): Payer: Medicare Other | Admitting: Cardiology

## 2011-11-11 VITALS — BP 111/60 | HR 85 | Resp 16 | Ht 63.0 in | Wt 86.0 lb

## 2011-11-11 DIAGNOSIS — I10 Essential (primary) hypertension: Secondary | ICD-10-CM

## 2011-11-11 DIAGNOSIS — M810 Age-related osteoporosis without current pathological fracture: Secondary | ICD-10-CM

## 2011-11-11 DIAGNOSIS — Z7901 Long term (current) use of anticoagulants: Secondary | ICD-10-CM

## 2011-11-11 DIAGNOSIS — I4891 Unspecified atrial fibrillation: Secondary | ICD-10-CM

## 2011-11-11 NOTE — Progress Notes (Signed)
Patient ID: [name] Young, female   DOB: [birth year], 78 y.o.   MRN: [id number]  HPI: Scheduled return visit for this nice woman with a history of paroxysmal atrial arrhythmias and hypertension.  Since her last visit, she has done quite well.  She has noted no palpitations, lightheadedness nor syncope.  She was admitted to hospital 2 months ago for a chronic obstructive pulmonary disease exacerbation and severe hypokalemia.  Treatment with diuretic was discontinued at that time.  Due to problems with maintaining her home including providing herself with adequate nutrition, she is now residing in a skilled nursing facility.  Prior to Admission medications   Medication Sig Start Date End Date Taking? Authorizing Provider  acetaminophen (TYLENOL) 500 MG tablet Take 500-1,000 mg by mouth every 6 (six) hours as needed. For pain   Yes Historical Provider, MD  Cyanocobalamin (VITAMIN B-12) 2500 MCG SUBL Place under the tongue. Injection 1 time monthly      Yes Historical Provider, MD  donepezil (ARICEPT) 5 MG tablet Take 5 mg by mouth at bedtime.   Yes Historical Provider, MD  metoprolol (TOPROL-XL) 100 MG 24 hr tablet TAKE 1 TABLET DAILY 05/26/11  Yes Kathlen Brunswick, MD  PRADAXA 150 MG CAPS TAKE  (1)  CAPSULE  TWICE DAILY. 03/19/11  Yes Kathlen Brunswick, MD  verapamil (VERELAN PM) 180 MG 24 hr capsule Take 1 capsule (180 mg total) by mouth daily. 02/12/11  Yes Kathlen Brunswick, MD   Allergies  Allergen Reactions  . Influenza Vac Typ Anaphylaxis     Past medical history, social history, and family history reviewed and updated.  ROS: Denies chest discomfort, orthopnea, PND and dyspnea on exertion.  No constipation, nausea, emesis, abdominal pain or diarrhea.  Substantial weight loss over the past year or 2 that has partially reversed since she left her home.  All other systems reviewed and are negative.  PHYSICAL EXAM: BP 111/60  Pulse 85  Resp 16  Ht 5\' 3"  (1.6 m)  Wt 39.009 kg (86 lb)  BMI  15.23 kg/m2   General-Well developed; no acute distress Body habitus-proportionate weight and height Neck-No JVD; no carotid bruits; bilateral miosis and arcus Lungs-clear lung fields; resonant to percussion Cardiovascular-normal PMI; Distant S1 and S2; modest systolic murmur at the apex Abdomen-normal bowel sounds; soft and non-tender without masses or organomegaly Musculoskeletal-No deformities, no cyanosis or clubbing Neurologic-Normal cranial nerves; symmetric strength and tone Skin-Warm, no significant lesions Extremities-distal pulses intact; 1+ edema  ASSESSMENT AND PLAN:   Bing, MD 11/12/2011 6:56 AM

## 2011-11-11 NOTE — Patient Instructions (Signed)
Your physician recommends that you schedule a follow-up appointment in: 1 year  Stool cards x 3 and return to office asap  

## 2011-11-12 ENCOUNTER — Encounter: Payer: Self-pay | Admitting: Cardiology

## 2011-11-12 NOTE — Assessment & Plan Note (Signed)
The patient has done very well on treatment with dabigatran.  Due to her advanced age and other health problems, she is not interested in considering a screening colonoscopy.  Stool for Hemoccult testing will be obtained.

## 2011-11-12 NOTE — Assessment & Plan Note (Signed)
Blood pressure control has been excellent; current medication will be continued. 

## 2011-11-12 NOTE — Assessment & Plan Note (Signed)
No clinical evidence for recurrence in recent years.  If any problems develop with anticoagulation, the risk-benefit ratio will shift towards non-treatment.

## 2011-11-21 ENCOUNTER — Encounter: Payer: Self-pay | Admitting: *Deleted

## 2011-12-02 ENCOUNTER — Telehealth: Payer: Self-pay | Admitting: *Deleted

## 2011-12-02 ENCOUNTER — Encounter: Payer: Self-pay | Admitting: *Deleted

## 2011-12-02 NOTE — Telephone Encounter (Signed)
Unable to reach patient by phone, as number has been disconnected, regarding stool cards.  Will issue second letter.

## 2011-12-22 ENCOUNTER — Other Ambulatory Visit: Payer: Self-pay

## 2011-12-22 ENCOUNTER — Encounter (INDEPENDENT_AMBULATORY_CARE_PROVIDER_SITE_OTHER): Payer: Medicare Other

## 2011-12-22 DIAGNOSIS — Z7901 Long term (current) use of anticoagulants: Secondary | ICD-10-CM

## 2011-12-22 NOTE — Progress Notes (Signed)
**Note De-Identified Joycelin Radloff Obfuscation** Addended by: Demetrios Loll on: 12/22/2011 03:24 PM   Modules accepted: Orders

## 2012-03-04 ENCOUNTER — Other Ambulatory Visit (HOSPITAL_COMMUNITY): Payer: Self-pay | Admitting: Internal Medicine

## 2012-03-04 DIAGNOSIS — R1310 Dysphagia, unspecified: Secondary | ICD-10-CM

## 2012-03-08 ENCOUNTER — Other Ambulatory Visit (HOSPITAL_COMMUNITY): Payer: Medicare Other

## 2012-03-09 ENCOUNTER — Other Ambulatory Visit (HOSPITAL_COMMUNITY): Payer: Medicare Other

## 2012-03-11 ENCOUNTER — Ambulatory Visit (HOSPITAL_COMMUNITY)
Admission: RE | Admit: 2012-03-11 | Discharge: 2012-03-11 | Disposition: A | Discharge status: Home / Self Care | Payer: Medicare Other | Source: Ambulatory Visit | Attending: Internal Medicine | Admitting: Internal Medicine

## 2012-03-11 DIAGNOSIS — IMO0001 Reserved for inherently not codable concepts without codable children: Secondary | ICD-10-CM | POA: Insufficient documentation

## 2012-03-11 DIAGNOSIS — R1310 Dysphagia, unspecified: Secondary | ICD-10-CM | POA: Insufficient documentation

## 2012-03-11 DIAGNOSIS — I10 Essential (primary) hypertension: Secondary | ICD-10-CM | POA: Insufficient documentation

## 2012-03-11 NOTE — Procedures (Signed)
Objective Swallowing Evaluation: Modified Barium Swallowing Study  Patient Details  Name: [name] Young MRN: [id number] Date of Birth: [birth year]  Today's Date: 03/11/2012 Time:  - 14:47    Past Medical History:  Past Medical History  Diagnosis Date  . Hypertension   . Osteoporosis   . Zenker's diverticulum     surgical repair at [facility] in 2006  . Tobacco abuse     40 pack year history discontinued in 1992  . TIA (transient ischemic attack)     Occurred post THA  . Campath-induced atrial fibrillation 2005    Echocardiogram in 2005-mild LVH; minimal AF; normal EF  . Chronic anticoagulation 2005   Past Surgical History:  Past Surgical History  Procedure Date  . Tonsillectomy   . Thalamotomy 1997  . Dilation and curettage, diagnostic / therapeutic 1960  . Cataract extraction, bilateral   . Zenker's diverticulectomy 2006    [facility]   HPI:  [name] Young, is an 78 year old female resident at [facility] [address]. She was referred for MBSS by Dr. Carylon Perches due to reports of patient coughing/hacking during meals for several months. She is now seated alone in the dining room because it is bothersome to other residents.   Symptoms/Limitations Symptoms: Pt with decreased cognition and denied swallowing difficulties until she started eating. Special Tests: MBSS  Recommendation/Prognosis  Clinical Impression Dysphagia Diagnosis: Moderate pharyngeal phase dysphagia;Severe cervical esophageal phase dysphagia;Suspected primary esophageal dysphagia Clinical impression: [name] Young presents with moderate pharyngeal phase dysphagia which is negatively impacted by severe cervical esophageal phase dysphagia and suspected primary esophageal dysphagia characterized by decreased epiglottic deflection, decreased hyolaryngeal excursion, overall decreased pharyngeal presssure, with decreased relaxation of cricopharyngeus resulting in significant pharyngeal residue of all  textures and consistencies and pocketing in what appears to be a Zenker's Diverticulum. Pt had flash episodes of penetration without aspiration with liquid consistencies. Pt eventually clears most of the bolus with repeat swallows and alternating sips of liquid. Tactile pressure with clinician's hand to right neck of pt did allow for improved bolus transit/clearance, however this technique could not be replicated with pt placing her own hand to neck to due cognition. Recommend: Dysphagia 3 (mechanical soft) with thin liquids by cup or straw, crush meds as able, alternate solids and liquids, strict reflux precautions. Pt will need to swallow several times with each bite/sip to clear. She is at high risk for aspiration (however she protects her airway surpisingly well right now) and malnutrition. She may benefit from ENT/GI consult for Zenker's and nutrition consult (high caloric liquids) due to fatigue over the course of a meal. Swallow Evaluation Recommendations Recommended Consults: Consider ENT evaluation;Consider GI evaluation (Unsure if there is anything they can do given age/cognition) Diet Recommendations: Dysphagia 3 (Mechanical Soft);Thin liquid Liquid Administration via: Cup;Straw Medication Administration: Crushed with puree Supervision: Patient able to self feed;Intermittent supervision to cue for compensatory strategies Compensations: Multiple dry swallows after each bite/sip;Follow solids with liquid;Effortful swallow Postural Changes and/or Swallow Maneuvers: Seated upright 90 degrees;Upright 30-60 min after meal Oral Care Recommendations: Oral care BID Other Recommendations: Clarify dietary restrictions Follow up Recommendations: None   Individuals Consulted Consulted and Agree with Results and Recommendations: Patient unable/family or caregiver not available;RN (left message for Dr. Ouida Sills) Report Sent to : Facility (Comment);Referring physician [facility])  SLP  Assessment/Plan Dysphagia Diagnosis: Moderate pharyngeal phase dysphagia;Severe cervical esophageal phase dysphagia;Suspected primary esophageal dysphagia Clinical impression: [name] Young presents with moderate pharyngeal phase dysphagia which is negatively impacted by  severe cervical esophageal phase dysphagia and suspected primary esophageal dysphagia characterized by decreased epiglottic deflection, decreased hyolaryngeal excursion, overall decreased pharyngeal presssure, with decreased relaxation of cricopharyngeus resulting in significant pharyngeal residue of all textures and consistencies and pocketing in what appears to be a Zenker's Diverticulum. Pt had flash episodes of penetration without aspiration with liquid consistencies. Pt eventually clears most of the bolus with repeat swallows and alternating sips of liquid. Tactile pressure with clinician's hand to right neck of pt did allow for improved bolus transit/clearance, however this technique could not be replicated with pt placing her own hand to neck to due cognition. Recommend: Dysphagia 3 (mechanical soft) with thin liquids by cup or straw, crush meds as able, alternate solids and liquids, strict reflux precautions. Pt will need to swallow several times with each bite/sip to clear. She is at high risk for aspiration (however she protects her airway surpisingly well right now) and malnutrition. She may benefit from ENT/GI consult for Zenker's and nutrition consult (high caloric liquids) due to fatigue over the course of a meal.  General:  Date of Onset: 03/04/12 HPI: [name] Young, is an 78 year old female resident at [facility] [address]. She was referred for MBSS by Dr. Carylon Perches due to reports of patient coughing/hacking during meals for several months. She is now seated alone in the dining room because it is bothersome to other residents.  Type of Study: Modified Barium Swallowing Study Reason for Referral: Objectively  evaluate swallowing function Previous Swallow Assessment: None on record, however esophagram completed in 2007 (Zenker's diverticulum, hiatal hernia, and Schatkhi's ring) Diet Prior to this Study: Regular;Thin liquids Temperature Spikes Noted: No Respiratory Status: Room air History of Recent Intubation: No Behavior/Cognition: Alert;Cooperative;Hard of hearing Oral Cavity - Dentition: Adequate natural dentition Oral Motor / Sensory Function: Within functional limits Self-Feeding Abilities: Able to feed self Patient Positioning: Upright in chair Baseline Vocal Quality: Clear Volitional Cough: Strong Volitional Swallow: Able to elicit Anatomy: Within functional limits Pharyngeal Secretions: Not observed secondary MBS  Reason for Referral:  Objectively evaluate swallowing function   Oral Phase Oral Preparation/Oral Phase Oral Phase: WFL  Pharyngeal Phase  Pharyngeal Phase Pharyngeal Phase: Impaired Pharyngeal - Nectar Pharyngeal - Nectar Straw: Premature spillage to pyriform sinuses;Reduced pharyngeal peristalsis;Reduced epiglottic inversion;Reduced laryngeal elevation;Reduced anterior laryngeal mobility;Penetration/Aspiration during swallow;Penetration/Aspiration after swallow;Pharyngeal residue - pyriform sinuses;Lateral channel residue Penetration/Aspiration details (nectar straw): Material enters airway, remains ABOVE vocal cords then ejected out;Material enters airway, CONTACTS cords then ejected out;Material enters airway, remains ABOVE vocal cords and not ejected out Pharyngeal - Thin Pharyngeal - Thin Cup: Premature spillage to pyriform sinuses;Reduced epiglottic inversion;Reduced laryngeal elevation;Reduced anterior laryngeal mobility;Reduced airway/laryngeal closure;Penetration/Aspiration during swallow;Pharyngeal residue - pyriform sinuses;Reduced pharyngeal peristalsis;Lateral channel residue Penetration/Aspiration details (thin cup): Material does not enter airway;Material  enters airway, remains ABOVE vocal cords and not ejected out;Material enters airway, remains ABOVE vocal cords then ejected out Pharyngeal - Thin Straw: Premature spillage to pyriform sinuses;Reduced epiglottic inversion;Reduced laryngeal elevation;Reduced anterior laryngeal mobility;Reduced airway/laryngeal closure;Reduced pharyngeal peristalsis;Penetration/Aspiration during swallow;Pharyngeal residue - pyriform sinuses;Lateral channel residue Penetration/Aspiration details (thin straw): Material does not enter airway;Material enters airway, remains ABOVE vocal cords then ejected out Pharyngeal - Solids Pharyngeal - Puree: Premature spillage to valleculae;Reduced pharyngeal peristalsis;Reduced epiglottic inversion;Reduced laryngeal elevation;Reduced anterior laryngeal mobility;Pharyngeal residue - posterior pharnyx;Pharyngeal residue - cp segment;Pharyngeal residue - pyriform sinuses;Pharyngeal residue - valleculae Pharyngeal - Mechanical Soft: Premature spillage to valleculae;Reduced pharyngeal peristalsis;Reduced epiglottic inversion;Reduced anterior laryngeal mobility;Reduced laryngeal elevation;Pharyngeal residue - pyriform sinuses;Pharyngeal  residue - cp segment Pharyngeal Phase - Comment Pharyngeal Comment: Residuals and penetration worse with nectars over thin. Significant stasis of bolus in hypopharynx with poor clearance through UES.  Cervical Esophageal Phase  Cervical Esophageal Phase Cervical Esophageal Phase: Impaired (Impaired for all consistencies/textures) Cervical Esophageal Phase - Solids Puree: Reduced cricopharyngeal relaxation;Prominent cricopharyngeal segment;Esophageal backflow into cervical esophagus Cervical Esophageal Phase - Comment Cervical Esophageal Comment: Extremely prominet cricopharyngeus poor bolus clearance and what appears to be a Zenker's diverticulum. Decreased esophageal motility with poor clearance of bolus with primary peristaltic waves.  Thank  you,  Havery Moros, CCC-SLP [phone]  Romond Pipkins 03/11/2012, 3:50 PM

## 2012-05-05 ENCOUNTER — Other Ambulatory Visit (HOSPITAL_COMMUNITY): Payer: Self-pay | Admitting: Internal Medicine

## 2012-05-05 ENCOUNTER — Other Ambulatory Visit: Payer: Self-pay | Admitting: Internal Medicine

## 2012-05-05 DIAGNOSIS — N6489 Other specified disorders of breast: Secondary | ICD-10-CM

## 2012-05-05 DIAGNOSIS — Z139 Encounter for screening, unspecified: Secondary | ICD-10-CM

## 2012-05-10 ENCOUNTER — Ambulatory Visit
Admission: RE | Admit: 2012-05-10 | Discharge: 2012-05-10 | Disposition: A | Discharge status: Home / Self Care | Payer: Medicare Other | Source: Ambulatory Visit | Attending: Internal Medicine | Admitting: Internal Medicine

## 2012-05-10 ENCOUNTER — Ambulatory Visit (HOSPITAL_COMMUNITY): Payer: Medicare Other

## 2012-05-10 DIAGNOSIS — N6489 Other specified disorders of breast: Secondary | ICD-10-CM

## 2012-10-14 ENCOUNTER — Ambulatory Visit (INDEPENDENT_AMBULATORY_CARE_PROVIDER_SITE_OTHER): Payer: Medicare Other | Admitting: Otolaryngology

## 2012-10-26 ENCOUNTER — Other Ambulatory Visit (HOSPITAL_COMMUNITY): Payer: Self-pay | Admitting: Internal Medicine

## 2012-10-26 ENCOUNTER — Ambulatory Visit (HOSPITAL_COMMUNITY): Payer: Medicare Other

## 2012-10-26 ENCOUNTER — Ambulatory Visit (HOSPITAL_COMMUNITY)
Admission: RE | Admit: 2012-10-26 | Discharge: 2012-10-26 | Disposition: A | Discharge status: Home / Self Care | Payer: Medicare Other | Source: Ambulatory Visit | Attending: Internal Medicine | Admitting: Internal Medicine

## 2012-10-26 DIAGNOSIS — R05 Cough: Secondary | ICD-10-CM | POA: Insufficient documentation

## 2012-10-26 DIAGNOSIS — I10 Essential (primary) hypertension: Secondary | ICD-10-CM | POA: Insufficient documentation

## 2012-10-26 DIAGNOSIS — R059 Cough, unspecified: Secondary | ICD-10-CM | POA: Insufficient documentation

## 2012-10-26 MED ORDER — IOHEXOL 300 MG/ML  SOLN
80.0000 mL | Freq: Once | INTRAMUSCULAR | Status: AC | PRN
  Administered 2012-10-26: 80 mL via INTRAVENOUS

## 2012-10-27 LAB — POCT I-STAT, CHEM 8
Calcium, Ion: 1.28 mmol/L (ref 1.13–1.30)
Chloride: 107 mEq/L (ref 96–112)
HCT: 41 % (ref 36.0–46.0)
Hemoglobin: 13.9 g/dL (ref 12.0–15.0)
TCO2: 30 mmol/L (ref 0–100)

## 2013-07-05 ENCOUNTER — Other Ambulatory Visit: Payer: Self-pay | Admitting: Internal Medicine

## 2013-07-05 DIAGNOSIS — Z1231 Encounter for screening mammogram for malignant neoplasm of breast: Secondary | ICD-10-CM

## 2013-08-11 ENCOUNTER — Ambulatory Visit
Admission: RE | Admit: 2013-08-11 | Discharge: 2013-08-11 | Disposition: A | Discharge status: Home / Self Care | Payer: Medicare Other | Source: Ambulatory Visit | Attending: Internal Medicine | Admitting: Internal Medicine

## 2013-08-11 DIAGNOSIS — Z1231 Encounter for screening mammogram for malignant neoplasm of breast: Secondary | ICD-10-CM

## 2013-08-14 ENCOUNTER — Inpatient Hospital Stay (HOSPITAL_COMMUNITY)
Admission: EM | Admit: 2013-08-14 | Discharge: 2013-08-22 | DRG: 291 | Disposition: A | Discharge status: Nursing Home (Includes ICF) | Payer: Medicare Other | Attending: Internal Medicine | Admitting: Internal Medicine

## 2013-08-14 ENCOUNTER — Emergency Department (HOSPITAL_COMMUNITY): Payer: Medicare Other

## 2013-08-14 ENCOUNTER — Encounter (HOSPITAL_COMMUNITY): Payer: Self-pay | Admitting: Emergency Medicine

## 2013-08-14 DIAGNOSIS — G9340 Encephalopathy, unspecified: Secondary | ICD-10-CM

## 2013-08-14 DIAGNOSIS — D72829 Elevated white blood cell count, unspecified: Secondary | ICD-10-CM | POA: Diagnosis present

## 2013-08-14 DIAGNOSIS — Z87891 Personal history of nicotine dependence: Secondary | ICD-10-CM

## 2013-08-14 DIAGNOSIS — Z7901 Long term (current) use of anticoagulants: Secondary | ICD-10-CM

## 2013-08-14 DIAGNOSIS — I4891 Unspecified atrial fibrillation: Secondary | ICD-10-CM | POA: Diagnosis present

## 2013-08-14 DIAGNOSIS — E876 Hypokalemia: Secondary | ICD-10-CM

## 2013-08-14 DIAGNOSIS — E870 Hyperosmolality and hypernatremia: Secondary | ICD-10-CM | POA: Diagnosis present

## 2013-08-14 DIAGNOSIS — M8448XD Pathological fracture, other site, subsequent encounter for fracture with routine healing: Secondary | ICD-10-CM

## 2013-08-14 DIAGNOSIS — J189 Pneumonia, unspecified organism: Secondary | ICD-10-CM

## 2013-08-14 DIAGNOSIS — R7309 Other abnormal glucose: Secondary | ICD-10-CM | POA: Diagnosis present

## 2013-08-14 DIAGNOSIS — R0902 Hypoxemia: Secondary | ICD-10-CM

## 2013-08-14 DIAGNOSIS — J441 Chronic obstructive pulmonary disease with (acute) exacerbation: Secondary | ICD-10-CM | POA: Diagnosis present

## 2013-08-14 DIAGNOSIS — R4182 Altered mental status, unspecified: Secondary | ICD-10-CM | POA: Diagnosis present

## 2013-08-14 DIAGNOSIS — F0280 Dementia in other diseases classified elsewhere without behavioral disturbance: Secondary | ICD-10-CM | POA: Diagnosis present

## 2013-08-14 DIAGNOSIS — E46 Unspecified protein-calorie malnutrition: Secondary | ICD-10-CM | POA: Diagnosis present

## 2013-08-14 DIAGNOSIS — Z66 Do not resuscitate: Secondary | ICD-10-CM | POA: Diagnosis present

## 2013-08-14 DIAGNOSIS — M810 Age-related osteoporosis without current pathological fracture: Secondary | ICD-10-CM | POA: Diagnosis present

## 2013-08-14 DIAGNOSIS — G309 Alzheimer's disease, unspecified: Secondary | ICD-10-CM | POA: Diagnosis present

## 2013-08-14 DIAGNOSIS — I509 Heart failure, unspecified: Secondary | ICD-10-CM | POA: Diagnosis present

## 2013-08-14 DIAGNOSIS — I5033 Acute on chronic diastolic (congestive) heart failure: Principal | ICD-10-CM | POA: Diagnosis present

## 2013-08-14 DIAGNOSIS — J9600 Acute respiratory failure, unspecified whether with hypoxia or hypercapnia: Secondary | ICD-10-CM | POA: Diagnosis present

## 2013-08-14 DIAGNOSIS — I10 Essential (primary) hypertension: Secondary | ICD-10-CM | POA: Diagnosis present

## 2013-08-14 DIAGNOSIS — Z681 Body mass index (BMI) 19 or less, adult: Secondary | ICD-10-CM

## 2013-08-14 HISTORY — DX: Dementia in other diseases classified elsewhere, unspecified severity, without behavioral disturbance, psychotic disturbance, mood disturbance, and anxiety: F02.80

## 2013-08-14 HISTORY — DX: Chronic obstructive pulmonary disease, unspecified: J44.9

## 2013-08-14 HISTORY — DX: Alzheimer's disease, unspecified: G30.9

## 2013-08-14 HISTORY — DX: Scoliosis, unspecified: M41.9

## 2013-08-14 HISTORY — DX: Deficiency of other specified B group vitamins: E53.8

## 2013-08-14 HISTORY — DX: Nonrheumatic aortic (valve) stenosis: I35.0

## 2013-08-14 LAB — URINALYSIS, ROUTINE W REFLEX MICROSCOPIC
Bilirubin Urine: NEGATIVE
GLUCOSE, UA: NEGATIVE mg/dL
Ketones, ur: NEGATIVE mg/dL
Leukocytes, UA: NEGATIVE
Nitrite: NEGATIVE
PROTEIN: 100 mg/dL — AB
UROBILINOGEN UA: 0.2 mg/dL (ref 0.0–1.0)
pH: 5 (ref 5.0–8.0)

## 2013-08-14 LAB — POCT I-STAT, CHEM 8
BUN: 16 mg/dL (ref 6–23)
CREATININE: 0.9 mg/dL (ref 0.50–1.10)
Calcium, Ion: 1.24 mmol/L (ref 1.13–1.30)
Chloride: 104 mEq/L (ref 96–112)
Glucose, Bld: 218 mg/dL — ABNORMAL HIGH (ref 70–99)
HEMATOCRIT: 38 % (ref 36.0–46.0)
Hemoglobin: 12.9 g/dL (ref 12.0–15.0)
POTASSIUM: 2.9 meq/L — AB (ref 3.7–5.3)
SODIUM: 143 meq/L (ref 137–147)
TCO2: 25 mmol/L (ref 0–100)

## 2013-08-14 LAB — BLOOD GAS, ARTERIAL
Acid-Base Excess: 0.2 mmol/L (ref 0.0–2.0)
BICARBONATE: 25.8 meq/L — AB (ref 20.0–24.0)
FIO2: 100 %
O2 Saturation: 99.6 %
PCO2 ART: 53.6 mmHg — AB (ref 35.0–45.0)
PH ART: 7.304 — AB (ref 7.350–7.450)
PO2 ART: 352 mmHg — AB (ref 80.0–100.0)
Patient temperature: 37
TCO2: 24.1 mmol/L (ref 0–100)

## 2013-08-14 LAB — GLUCOSE, CAPILLARY: Glucose-Capillary: 203 mg/dL — ABNORMAL HIGH (ref 70–99)

## 2013-08-14 LAB — MRSA PCR SCREENING: MRSA BY PCR: NEGATIVE

## 2013-08-14 LAB — URINE MICROSCOPIC-ADD ON

## 2013-08-14 LAB — POCT I-STAT TROPONIN I: TROPONIN I, POC: 0.06 ng/mL (ref 0.00–0.08)

## 2013-08-14 LAB — CG4 I-STAT (LACTIC ACID): LACTIC ACID, VENOUS: 4.57 mmol/L — AB (ref 0.5–2.2)

## 2013-08-14 LAB — DIGOXIN LEVEL

## 2013-08-14 LAB — D-DIMER, QUANTITATIVE: D-Dimer, Quant: 1.32 ug/mL-FEU — ABNORMAL HIGH (ref 0.00–0.48)

## 2013-08-14 MED ORDER — DABIGATRAN ETEXILATE MESYLATE 150 MG PO CAPS
150.0000 mg | ORAL_CAPSULE | Freq: Two times a day (BID) | ORAL | Status: DC
  Administered 2013-08-15 – 2013-08-22 (×13): 150 mg via ORAL
  Filled 2013-08-14 (×20): qty 1

## 2013-08-14 MED ORDER — ACETAMINOPHEN 500 MG PO TABS
500.0000 mg | ORAL_TABLET | Freq: Four times a day (QID) | ORAL | Status: DC | PRN
  Filled 2013-08-14: qty 1

## 2013-08-14 MED ORDER — METOPROLOL SUCCINATE ER 50 MG PO TB24
100.0000 mg | ORAL_TABLET | Freq: Every day | ORAL | Status: DC
  Administered 2013-08-15 – 2013-08-18 (×3): 100 mg via ORAL
  Filled 2013-08-14 (×3): qty 2

## 2013-08-14 MED ORDER — DEXTROSE 5 % IV SOLN
1.0000 g | Freq: Once | INTRAVENOUS | Status: AC
  Administered 2013-08-14: 1 g via INTRAVENOUS

## 2013-08-14 MED ORDER — MIRTAZAPINE 30 MG PO TABS
15.0000 mg | ORAL_TABLET | Freq: Every day | ORAL | Status: DC
  Administered 2013-08-15: 15 mg via ORAL
  Filled 2013-08-14 (×2): qty 1

## 2013-08-14 MED ORDER — DEXTROSE 5 % IV SOLN
1.0000 g | INTRAVENOUS | Status: DC
  Administered 2013-08-14 – 2013-08-16 (×3): 1 g via INTRAVENOUS
  Filled 2013-08-14 (×3): qty 10

## 2013-08-14 MED ORDER — SERTRALINE HCL 50 MG PO TABS
50.0000 mg | ORAL_TABLET | Freq: Every day | ORAL | Status: DC
  Administered 2013-08-15 – 2013-08-17 (×2): 50 mg via ORAL
  Filled 2013-08-14 (×3): qty 1

## 2013-08-14 MED ORDER — SODIUM CHLORIDE 0.9 % IV SOLN
INTRAVENOUS | Status: DC
  Administered 2013-08-14: 21:00:00 via INTRAVENOUS

## 2013-08-14 MED ORDER — FUROSEMIDE 20 MG PO TABS: 20.0000 mg | ORAL_TABLET | ORAL | Status: DC

## 2013-08-14 MED ORDER — DIGOXIN 125 MCG PO TABS
0.1250 mg | ORAL_TABLET | Freq: Every day | ORAL | Status: DC
  Administered 2013-08-15 – 2013-08-22 (×7): 0.125 mg via ORAL
  Filled 2013-08-14 (×7): qty 1

## 2013-08-14 MED ORDER — DOCUSATE SODIUM 100 MG PO CAPS
100.0000 mg | ORAL_CAPSULE | Freq: Two times a day (BID) | ORAL | Status: DC
  Administered 2013-08-15 – 2013-08-21 (×8): 100 mg via ORAL
  Filled 2013-08-14 (×10): qty 1

## 2013-08-14 MED ORDER — DONEPEZIL HCL 5 MG PO TABS
10.0000 mg | ORAL_TABLET | Freq: Every day | ORAL | Status: DC
  Administered 2013-08-15 – 2013-08-17 (×2): 10 mg via ORAL
  Filled 2013-08-14 (×3): qty 2

## 2013-08-14 MED ORDER — PANTOPRAZOLE SODIUM 40 MG PO TBEC
40.0000 mg | DELAYED_RELEASE_TABLET | Freq: Every day | ORAL | Status: DC
  Administered 2013-08-15 – 2013-08-22 (×6): 40 mg via ORAL
  Filled 2013-08-14 (×6): qty 1

## 2013-08-14 MED ORDER — SODIUM CHLORIDE 0.9 % IV BOLUS (SEPSIS)
500.0000 mL | Freq: Once | INTRAVENOUS | Status: AC
  Administered 2013-08-14: 500 mL via INTRAVENOUS

## 2013-08-14 MED ORDER — CEFUROXIME AXETIL 250 MG PO TABS
250.0000 mg | ORAL_TABLET | Freq: Two times a day (BID) | ORAL | Status: DC
Start: 2013-08-14 — End: 2013-08-14

## 2013-08-14 MED ORDER — FUROSEMIDE 20 MG PO TABS: 20.0000 mg | ORAL_TABLET | Freq: Every day | ORAL | Status: DC

## 2013-08-14 NOTE — ED Notes (Signed)
CRITICAL VALUE ALERT  Critical value received:  Potassium 2.9  Date of notification: 08/14/2013  Time of notification:  1008  Critical value read back:yes  Nurse who received alert:  Tarri Glennrystal Sharry Beining RN   MD notified (1st page): Dr Bebe ShaggyWickline  Time of first page:  1012  MD notified (2nd page):  Time of second page:  Responding MD:  Dr Bebe ShaggyWickline  Time MD responded: 1012

## 2013-08-14 NOTE — ED Notes (Signed)
Patient brought in via EMS. Per EMS called out of non-responsive. Patient alert at this time, non verbal at this time. [facility]. Per EMS-staff stated that patient was walking in hall with walker, started shaking, and fell to floor. Per EMS patient did vomit. Patient reports that patient never had CPR performed. No hx of seizure or tremors. Patient's blood glucose per EMS 250. EMS reports patient's original O2 sat 47% upon their arrival. Patient placed on nonrebreather with 02 sat of 97%

## 2013-08-14 NOTE — ED Notes (Signed)
Patient's family now in room. Patient verbalized that she was cold. Patient's O2 sat 85% on room air and 87% on 3 liters via [place]. Non-rebreather reapplied. Dr Bebe ShaggyWickline made aware.

## 2013-08-14 NOTE — ED Provider Notes (Signed)
CSN: [id number]     Arrival date & time 08/14/13  4098 History  This chart was scribed for [name] Gaskins, MD by Lindajo Royal, ED Scribe. This patient was seen in room Room/bed info not found and the patient's care was started at 9:50 AM.   Chief Complaint  Patient presents with  . Altered Mental Status    Patient is a 80 y.o. female presenting with altered mental status. The history is provided by the EMS personnel. No language interpreter was used.  Altered Mental Status Presenting symptoms: unresponsiveness   Severity:  Severe Most recent episode:  Today Episode history:  Single Duration: Began this morning. Timing:  Constant Chronicity:  New Context: nursing home resident   Context: not head injury   Associated symptoms: abnormal movement ("shaking") and vomiting     Level 5 Caveat: Patient Unresponsive  HPI Comments: [name] Young is a 80 y.o. female brought in by EMS to the Emergency Department complaining of AMS.  Per EMS personnel, pt was walking down the hallway at her nursing home when she started "shaking all-over, not seizure-like, more parkinsonian."  Pt has no prior h/o seizures or tremors.  She then fell and vomited and was found to be unresponsive by nursing home staff.  On EMS arrival her eyes were open and she was watching them but was non-verbal.  EKG en route was apart from A-fib which patient has a history of.  However O2 sats dropped to 40's en route.  She was placed on nonrebreather and reached 97%.  EMS is concerned that pt may have aspirated.  They deny trauma.  They deny loss of pulses and did not give CPR.  CBG en route was 250.   Pt is a DNR  Past Medical History  Diagnosis Date  . Hypertension   . Osteoporosis   . Zenker's diverticulum     surgical repair at [facility] in 2006  . Tobacco abuse     40 pack year history discontinued in 1992  . TIA (transient ischemic attack)     Occurred post THA  . Campath-induced atrial fibrillation 2005     Echocardiogram in 2005-mild LVH; minimal AF; normal EF  . Chronic anticoagulation 2005    Past Surgical History  Procedure Laterality Date  . Tonsillectomy    . Thalamotomy  1997  . Dilation and curettage, diagnostic / therapeutic  1960  . Cataract extraction, bilateral    . Zenker's diverticulectomy  2006    [facility]    Family History  Problem Relation Age of Onset  . Pneumonia Father     History  Substance Use Topics  . Smoking status: Former Games developer  . Smokeless tobacco: Never Used  . Alcohol Use: No    OB History   Grav Para Term Preterm Abortions TAB SAB Ect Mult Living                  Review of Systems  Unable to perform ROS: Patient unresponsive  Gastrointestinal: Positive for vomiting.     Allergies  Influenza virus vaccine split  Home Medications   Current Outpatient Rx  Name  Route  Sig  Dispense  Refill  . acetaminophen (TYLENOL) 500 MG tablet   Oral   Take 500-1,000 mg by mouth every 6 (six) hours as needed. For pain         . Cyanocobalamin (VITAMIN B-12) 2500 MCG SUBL   Sublingual   Place under the tongue. Injection 1 time monthly            .  donepezil (ARICEPT) 5 MG tablet   Oral   Take 5 mg by mouth at bedtime.         . metoprolol (TOPROL-XL) 100 MG 24 hr tablet      TAKE 1 TABLET DAILY   30 tablet   12   . PRADAXA 150 MG CAPS      TAKE  (1)  CAPSULE  TWICE DAILY.   60 capsule   6   . verapamil (VERELAN PM) 180 MG 24 hr capsule   Oral   Take 1 capsule (180 mg total) by mouth daily.   90 capsule   3    BP 147/87  Pulse 112  Ht 5\' 3"  (1.6 m)  Wt 90 lb (40.824 kg)  BMI 15.95 kg/m2  SpO2 100% BP 142/70  Pulse 95  Temp(Src) 99.3 F (37.4 C) (Rectal)  Ht 5\' 3"  (1.6 m)  Wt 90 lb (40.824 kg)  BMI 15.95 kg/m2  SpO2 99%   Physical Exam CONSTITUTIONAL: Elderly, frail HEAD: Normocephalic/atraumatic EYES: Pupils pinpoint. ENMT: Mucous membranes moist NECK: supple no meningeal signs CV: Irregular LUNGS:  Tachypnea, coarse breath sounds bilaterally ABDOMEN: soft, nontender, no rebound or guarding NEURO: Pt is awake, but not responding to voice or commands EXTREMITIES: pulses normal, full ROM SKIN: warm, color normal PSYCH: Appears confused   ED Course  Procedures (including critical care time) CRITICAL CARE Performed by: [name] Young Total critical care time: 35 Critical care time was exclusive of separately billable procedures and treating other patients. Critical care was necessary to treat or prevent imminent or life-threatening deterioration. Critical care was time spent personally by me on the following activities: development of treatment plan with patient and/or surrogate as well as nursing, discussions with consultants, evaluation of patient's response to treatment, examination of patient, obtaining history from patient or surrogate, ordering and performing treatments and interventions, ordering and review of laboratory studies, ordering and review of radiographic studies, pulse oximetry and re-evaluation of patient's condition.   DIAGNOSTIC STUDIES: Oxygen Saturation is 100% on non-rebreather mask, normal by my interpretation.    COORDINATION OF CARE: 10:00 AM-Communication limited by condition of patient.  Will order head CT, CXR, EKG, and labs.   Pt was significantly hypoxic for EMS and also unresponsive Pt presented for AMS and hypoxia.  Ct head negative.  Lactate was elevated (>4) so IV fluids ordered. It was also noted that patient may have some evidence of CHF by xray, and reports from EMS is that she may have aspirated prior to arrival (she had some vomiting at nursing facility)   Her mental status is improving but still not at baseline (usually ambulatory)   She is still requiring oxygen (desat with nasal cannula) Family at bedside (nieces) reports she is usually have higher functioning/ambulatory D/w dr Sudie Bailey will admit Given she may have aspirated, will start  broad spectrum antibiotics Pt stabilized in the ER  Labs Review Labs Reviewed  GLUCOSE, CAPILLARY - Abnormal; Notable for the following:    Glucose-Capillary 203 (*)    All other components within normal limits  URINALYSIS, ROUTINE W REFLEX MICROSCOPIC - Abnormal; Notable for the following:    Specific Gravity, Urine >1.030 (*)    Hgb urine dipstick TRACE (*)    Protein, ur 100 (*)    All other components within normal limits  URINE MICROSCOPIC-ADD ON - Abnormal; Notable for the following:    Squamous Epithelial / LPF FEW (*)    Bacteria, UA MANY (*)    All  other components within normal limits  POCT I-STAT, CHEM 8 - Abnormal; Notable for the following:    Potassium 2.9 (*)    Glucose, Bld 218 (*)    All other components within normal limits  CG4 I-STAT (LACTIC ACID) - Abnormal; Notable for the following:    Lactic Acid, Venous 4.57 (*)    All other components within normal limits  POCT I-STAT TROPONIN I    Imaging Review Ct Head Wo Contrast  08/14/2013   CLINICAL DATA:  Shortness of breath, altered mental status  EXAM: CT HEAD WITHOUT CONTRAST  TECHNIQUE: Contiguous axial images were obtained from the base of the skull through the vertex without intravenous contrast.  COMPARISON:  09/03/2011  FINDINGS: No evidence of parenchymal hemorrhage or extra-axial fluid collection. No mass lesion, mass effect, or midline shift.  No CT evidence of acute infarction.  Subcortical white matter and periventricular small vessel ischemic changes. Intracranial atherosclerosis.  Global cortical atrophy.  Secondary ventricular prominence.  Mild mucosal thickening in the left maxillary sinus. Visualized paranasal sinuses and mastoid air cells are essentially clear.  No evidence of calvarial fracture.  IMPRESSION: No evidence of acute intracranial abnormality.  Atrophy with small vessel ischemic changes and intracranial atherosclerosis.   Electronically Signed   By: Charline BillsSriyesh  Krishnan M.D.   On: 08/14/2013  11:32   Dg Chest Portable 1 View  08/14/2013   CLINICAL DATA:  Altered mental status, shortness of breath, COPD  EXAM: PORTABLE CHEST - 1 VIEW  COMPARISON:  10/26/2012, 09/02/2011  FINDINGS: Cardiomegaly evident with increased vascular and interstitial prominence centrally compatible with developing edema/ CHF. Background COPD/ emphysema noted. Calcified pleural plaque on the right. Atherosclerosis of the aorta. No effusion or pneumothorax.  IMPRESSION: Cardiomegaly with mild developing diffuse edema pattern.  Background COPD/ emphysema  Chronic right calcified pleural plaque   Electronically Signed   By: Ruel Favorsrevor  Shick M.D.   On: 08/14/2013 10:45    EKG Interpretation    Date/Time:  Sunday August 14 2013 09:55:07 EST Ventricular Rate:  114 PR Interval:    QRS Duration: 82 QT Interval:  340 QTC Calculation: 468 R Axis:   90 Text Interpretation:  Atrial fibrillation with rapid ventricular response with premature ventricular or aberrantly conducted complexes Anterolateral infarct (cited on or before 14-Aug-2013) ST \\T \ T wave abnormality, consider inferior ischemia Abnormal ECG Confirmed by Bebe ShaggyWICKLINE  MD, Andora Krull ([id number]) on 08/14/2013 10:06:19 AM            MDM  No diagnosis found. Nursing notes including past medical history and social history reviewed and considered in documentation xrays reviewed and considered Labs/vital reviewed and considered     I personally performed the services described in this documentation, which was scribed in my presence. The recorded information has been reviewed and is accurate.      [name] Gaskinsonald W Deanglo Hissong, MD 08/14/13 1209

## 2013-08-15 LAB — CBC
HEMATOCRIT: 36.1 % (ref 36.0–46.0)
Hemoglobin: 12.1 g/dL (ref 12.0–15.0)
MCH: 36.9 pg — ABNORMAL HIGH (ref 26.0–34.0)
MCHC: 33.5 g/dL (ref 30.0–36.0)
MCV: 110.1 fL — ABNORMAL HIGH (ref 78.0–100.0)
Platelets: 199 10*3/uL (ref 150–400)
RBC: 3.28 MIL/uL — ABNORMAL LOW (ref 3.87–5.11)
RDW: 13.4 % (ref 11.5–15.5)
WBC: 13.3 10*3/uL — ABNORMAL HIGH (ref 4.0–10.5)

## 2013-08-15 LAB — BLOOD GAS, ARTERIAL
Acid-Base Excess: 0.7 mmol/L (ref 0.0–2.0)
Bicarbonate: 26.7 mEq/L — ABNORMAL HIGH (ref 20.0–24.0)
DRAWN BY: 23534
Delivery systems: POSITIVE
EXPIRATORY PAP: 5
FIO2: 0.6 %
INSPIRATORY PAP: 12
O2 Saturation: 98.8 %
PCO2 ART: 58.5 mmHg — AB (ref 35.0–45.0)
PH ART: 7.281 — AB (ref 7.350–7.450)
PO2 ART: 164 mmHg — AB (ref 80.0–100.0)
Patient temperature: 37
TCO2: 24.6 mmol/L (ref 0–100)

## 2013-08-15 LAB — BASIC METABOLIC PANEL
BUN: 17 mg/dL (ref 6–23)
CHLORIDE: 110 meq/L (ref 96–112)
CO2: 27 mEq/L (ref 19–32)
Calcium: 8.9 mg/dL (ref 8.4–10.5)
Creatinine, Ser: 0.67 mg/dL (ref 0.50–1.10)
GFR calc Af Amer: >90 mL/min (ref >90)
GFR calc non Af Amer: 79 mL/min — ABNORMAL LOW (ref >90)
Glucose, Bld: 148 mg/dL — ABNORMAL HIGH (ref 70–99)
POTASSIUM: 3.7 meq/L (ref 3.7–5.3)
Sodium: 149 mEq/L — ABNORMAL HIGH (ref 137–147)

## 2013-08-15 LAB — PRO B NATRIURETIC PEPTIDE: PRO B NATRI PEPTIDE: 13781 pg/mL — AB (ref 0–450)

## 2013-08-15 LAB — MRSA PCR SCREENING: MRSA BY PCR: NEGATIVE

## 2013-08-15 MED ORDER — LORAZEPAM 2 MG/ML IJ SOLN
0.5000 mg | INTRAMUSCULAR | Status: DC | PRN
  Administered 2013-08-15 – 2013-08-16 (×2): 0.5 mg via INTRAVENOUS
  Filled 2013-08-15: qty 1

## 2013-08-15 MED ORDER — ENSURE COMPLETE PO LIQD
237.0000 mL | Freq: Two times a day (BID) | ORAL | Status: DC
  Administered 2013-08-17 – 2013-08-18 (×2): 237 mL via ORAL
  Administered 2013-08-21: 16:00:00 via ORAL
  Administered 2013-08-22 (×2): 237 mL via ORAL

## 2013-08-15 MED ORDER — POTASSIUM CHLORIDE CRYS ER 20 MEQ PO TBCR
20.0000 meq | EXTENDED_RELEASE_TABLET | Freq: Three times a day (TID) | ORAL | Status: AC
  Administered 2013-08-15 – 2013-08-16 (×4): 20 meq via ORAL
  Filled 2013-08-15 (×3): qty 1
  Filled 2013-08-15: qty 2
  Filled 2013-08-15: qty 1

## 2013-08-15 MED ORDER — ENSURE COMPLETE PO LIQD: 237.0000 mL | Freq: Three times a day (TID) | ORAL | Status: DC

## 2013-08-15 MED ORDER — FUROSEMIDE 10 MG/ML IJ SOLN: 40.0000 mg | Freq: Two times a day (BID) | INTRAMUSCULAR | Status: DC

## 2013-08-15 MED ORDER — LORAZEPAM 2 MG/ML IJ SOLN
INTRAMUSCULAR | Status: AC
  Administered 2013-08-15: 2 mg
  Filled 2013-08-15: qty 1

## 2013-08-15 MED ORDER — FUROSEMIDE 10 MG/ML IJ SOLN
40.0000 mg | Freq: Every day | INTRAMUSCULAR | Status: DC
  Administered 2013-08-15: 40 mg via INTRAVENOUS
  Filled 2013-08-15: qty 4

## 2013-08-15 MED ORDER — DEXTROSE 5 % IV SOLN
500.0000 mg | INTRAVENOUS | Status: DC
  Administered 2013-08-15 – 2013-08-16 (×2): 500 mg via INTRAVENOUS
  Filled 2013-08-15 (×3): qty 500

## 2013-08-15 MED ORDER — FUROSEMIDE 10 MG/ML IJ SOLN
40.0000 mg | Freq: Three times a day (TID) | INTRAMUSCULAR | Status: DC
Start: 2013-08-15 — End: 2013-08-16
  Administered 2013-08-15 – 2013-08-16 (×2): 40 mg via INTRAVENOUS
  Filled 2013-08-15 (×3): qty 4

## 2013-08-15 NOTE — Progress Notes (Signed)
Patient being transferred to ICU,report called,and given to [facility]. Nurse at bedside.Accompanied by staff to an awaiting floor.

## 2013-08-15 NOTE — Clinical Social Work Psychosocial (Signed)
Clinical Social Work Department BRIEF PSYCHOSOCIAL ASSESSMENT 08/15/2013  Patient:  [name] Young,[name] Young     Account Number:  [id number]     Admit date:  08/14/2013  Clinical Social Worker:  Nancie NeasSTULTZ,Azazel Franze, LCSW  Date/Time:  08/15/2013 05:50 PM  Referred by:  Physician  Date Referred:  08/15/2013 Referred for  ALF Placement   Other Referral:   Interview type:  Family Other interview type:   Clydie BraunKaren- niece    PSYCHOSOCIAL DATA Living Status:  FACILITY Admitted from facility:  [facility] Level of care:  Assisted Living Primary support name:  Clydie BraunKaren Primary support relationship to patient:  FAMILY Degree of support available:   supportive    CURRENT CONCERNS Current Concerns  Post-Acute Placement   Other Concerns:    SOCIAL WORK ASSESSMENT / PLAN CSW spoke with pt's niece/POA Clydie BraunKaren on phone as pt is in ICU and oriented to self only. Clydie BraunKaren reports pt has been a resident at [facility] for about 2 years. She went in to try it out for 30 days and has stayed since then. Pt has 3 nieces who live locally and are involved in care. Her son lives in [place]. Clydie BraunKaren said she was shocked to see pt's current condition from when she saw her several days ago. CSW provided brief support. Per French Anaracy, administrator at facility, pt was fine and then was walking down the hall when she fell and her mental status changed. Pt is fairly oriented at baseline, but has periods of confusion. She is fairly independent with ADLs and ambulates without assistive device. No home health prior to admission, but facility can do in house therapy if needed. Okay for return as long as ALF appropriate. Clydie BraunKaren reports desire for pt to [facility] to [facility], but is understanding if a higher level of care needs to be considered due to medical condition. CSW will continue to follow and evaluate for this if necessary.   Assessment/plan status:  Psychosocial Support/Ongoing Assessment of Needs Other assessment/ plan:    Information/referral to community resources:   [facility]    PATIENT'S/FAMILY'S RESPONSE TO PLAN OF CARE: Pt unable to discuss plan of care at this time. Family report very positive feelings regarding return to [facility] when stable. CSW will continue to follow and address d/c planning needs as appropriate.       Derenda FennelKara Haruki Arnold, KentuckyLCSW [phone]

## 2013-08-15 NOTE — H&P (Signed)
NAMEYASHEKA, FOSSETT NO.:  [id number]  MEDICAL RECORD NO.:  [id number]  LOCATION:  A341                          FACILITY:  [facility]  PHYSICIAN:  Mila Homer. Sudie Bailey, M.D.DATE OF BIRTH:  [birth year]  DATE OF ADMISSION:  08/14/2013 DATE OF DISCHARGE:  LH                             HISTORY & PHYSICAL   HISTORY OF PRESENT ILLNESS:  This 80 year old woman who is a resident at [facility], was walking down the hall today when apparently she fell and had a change in mental status.  She is brought to the [facility] for evaluation.  She has diagnoses of Alzheimer's dementia, COPD, osteoporosis, chronic anticoagulation for atrial fibrillation, and benign essential hypertension.  She has a history of cigarette smoking.  She also has had 3 vertebral compression fractures noted in the past.  CURRENT MEDICATIONS:  At the [facility] include acetaminophen 500 mg b.i.d. for pain, cefuroxime 250 mg b.i.d., cyanocobalamin 225 mcg sublingually once a month, digoxin 0.125 mg daily, docusate sodium 100 mg b.i.d., donepezil 10 mg at bedtime, furosemide 20 mg daily on Monday, Wednesday, and Friday, metoprolol 100 mg daily, mirtazapine 15 mg at bedtime, multivitamins daily, pantoprazole 40 mg daily, Pradaxa 150 mg b.i.d., and sertraline 50 mg at bedtime.  Her local doctor is Dr. Ouida Sills.  ALLERGIES:  SHE HAS ALLERGY TO INFLUENZA VIRUS VACCINE.  PHYSICAL EXAMINATION:  GENERAL:  Exam in the emergency room showed an elderly woman who was on Ventimask.  Apparently her O2 sat was in the 40s on the way in with EMT, so she was put on a non-rebreather mask, which brought her O2 sats up to the 80s or 90s by the time she was in emergency room.  She was not a good historian and in fact did not talk to me at all although she opened her eyes and looked at me.  She was somewhat emaciated, elderly, and quite frail. VITAL SIGNS:  Temperature is 97.8, pulse 59, respiratory  rate 22, blood pressure 131/87. HEART:  Regular rhythm with a rate of 60. LUNGS:  Appeared to have decreased breath sounds throughout but she is moving air well. ABDOMEN:  Soft without organomegaly or mass or tenderness on exam.  She had no edema of the ankles.  LABORATORY DATA:  CT of the head showed atrophy and small vessel ischemic changes and intracranial atherosclerosis.  EKG showed atrial fibrillation with rapid ventricular response rate of 114.  Chest x-ray showed cardiomegaly with diffuse edema pattern and background COPD/emphysema.  She has a chronic right calcified pleural plaque.  CT of the lung done earlier last year in March 2014 showed right upper lobe calcified pleural plaque, diffuse osteopenia, and chronic compression fractures of T5, T11, and T12.  In 2007, she had an EGD which showed a Zenker's diverticulum, which had been repaired in 2004, but at the time of the procedure measured 2.5 x 5 cm.  She also had hiatal hernia and Schatzki's ring.  She had some minimal aspiration even though she is in the right prone decubitus position.  Again, that was in 2007.  ADMISSION DIAGNOSES: 1. Sudden dyspnea. 2. Change in mental status. 3. Alzheimer disease. 4. Chronic obstructive  pulmonary disease. 5. Chronic atrial fibrillation. 6. Benign essential hypertension. 7. Osteoporosis status post 3 vertebral compression fractures and     generalized osteopenia. 8. Chronic anticoagulation with Pradaxa.  PLAN:  It was also felt that she might have had aspiration and therefore to have an aspiration pneumonia.  Her lactic acid was somewhat elevated and she may have infection that is causing all this.  To treat the possible aspiration pneumonia, she will be on ceftriaxone 1 gram  IV 24 hours.  She will be kept on other meds.  A digoxin level is pending as is D-dimer.  Blood gases really have not been done at this point and need to be.     Mila HomerStephen D. Sudie BaileyKnowlton,  M.D.     SDK/MEDQ  D:  08/14/2013  T:  08/15/2013  Job:  [id number]

## 2013-08-15 NOTE — Progress Notes (Signed)
INITIAL NUTRITION ASSESSMENT  DOCUMENTATION CODES Per approved criteria  -Underweight   INTERVENTION: Ensure Complete po BID, each supplement provides 350 kcal and 13 grams of protein  NUTRITION DIAGNOSIS: Inadequate oral intake related to poor appetite, respiratory status as evidenced by PO: 25%, pt on BiPap.   Goal: Pt will meet nutrition needs as able  Monitor:  PO intake, labs, weight changes, skin assessments, respiratory status  Reason for Assessment: MST=3  80 y.o. female  Admitting Dx: <principal problem not specified>  ASSESSMENT: Pt is a resident of [address] who was admitted for AMS and unresponsiveness. She was admitted to medical floor, but transferred to ICU due to respiratory distress. She is currently on Bipap. Wt hx reveals a 7.5% wt loss x 1 year, which is not clinically significant. However, suspect malnutrition given underweight status and continued weight loss. Poor appetite (PO: 25%).  Unable to perform physical exam at time of visit.   Height: Ht Readings from Last 1 Encounters:  08/14/13 5\' 3"  (1.6 m)    Weight: Wt Readings from Last 1 Encounters:  08/14/13 86 lb 13.8 oz (39.4 kg)    Ideal Body Weight: 115#  % Ideal Body Weight: 75%  Wt Readings from Last 10 Encounters:  08/14/13 86 lb 13.8 oz (39.4 kg)  11/11/11 86 lb (39.009 kg)  09/01/11 93 lb (42.185 kg)  02/12/11 96 lb (43.545 kg)  08/26/10 100 lb (45.36 kg)  07/22/10 98 lb (44.453 kg)  10/15/09 101 lb (45.813 kg)  07/17/09 101 lb (45.813 kg)  06/12/09 101 lb (45.813 kg)  05/01/09 100 lb (45.36 kg)    Usual Body Weight: 100#  % Usual Body Weight: 86%  BMI:  Body mass index is 15.39 kg/(m^2). Meets criteria for underweight.   Estimated Nutritional Needs: Kcal: 1000-1200 daily Protein: 39-49 grams daily Fluid: 1.0-1.2 L daily  Skin: Intact  Diet Order: Sodium Restricted  EDUCATION NEEDS: -Education not appropriate at this time   Intake/Output Summary (Last 24  hours) at 08/15/13 1602 Last data filed at 08/15/13 1400  Gross per 24 hour  Intake    180 ml  Output      4 ml  Net    176 ml    Last BM: 08/14/13  Labs:   Recent Labs Lab 08/14/13 1008 08/15/13 0551  NA 143 149*  K 2.9* 3.7  CL 104 110  CO2  --  27  BUN 16 17  CREATININE 0.90 0.67  CALCIUM  --  8.9  GLUCOSE 218* 148*    CBG (last 3)   Recent Labs  08/14/13 0953  GLUCAP 203*    Scheduled Meds: . azithromycin  500 mg Intravenous Q24H  . cefTRIAXone (ROCEPHIN) IVPB 1 gram/50 mL D5W  1 g Intravenous Q24H  . dabigatran  150 mg Oral Q12H  . digoxin  0.125 mg Oral Daily  . docusate sodium  100 mg Oral BID  . donepezil  10 mg Oral QHS  . furosemide  40 mg Intravenous Daily  . metoprolol succinate  100 mg Oral Daily  . mirtazapine  15 mg Oral QHS  . pantoprazole  40 mg Oral Daily  . potassium chloride  20 mEq Oral TID  . sertraline  50 mg Oral QHS    Continuous Infusions: . sodium chloride 10 mL/hr at 08/15/13 1400    Past Medical History  Diagnosis Date  . Hypertension   . Osteoporosis   . Zenker's diverticulum     surgical repair at [facility] in  2006  . Tobacco abuse     40 pack year history discontinued in 1992  . TIA (transient ischemic attack)     Occurred post THA  . Atrial fibrillation 2005    Echocardiogram in 2005-mild LVH; minimal AF; normal EF  . Chronic anticoagulation 2005  . COPD (chronic obstructive pulmonary disease)   . Alzheimer disease   . Scoliosis   . Aortic stenosis   . B12 deficiency     Past Surgical History  Procedure Laterality Date  . Tonsillectomy    . Thalamotomy  1997  . Dilation and curettage, diagnostic / therapeutic  1960  . Cataract extraction, bilateral    . [facility]  [facility]    Greysen Swanton A. Mayford KnifeWilliams, RD, LDN Pager: [phone]

## 2013-08-15 NOTE — Progress Notes (Signed)
Pt was found to have O2 sat of 52% on 4L and very short of breath.  Pt placed on 100% NRB and O2 sat improved to 95%. Pt continues to have increased work of breathing.  Pt placed on BIPAP per MD order.  Pt tolerating well at this time.  RT will continue to monitor.

## 2013-08-15 NOTE — Progress Notes (Signed)
UR chart review completed.  

## 2013-08-15 NOTE — Progress Notes (Addendum)
Patient's sat 52 on 4 liters nasal canula,B/P  136/82 hr,83, Dr Ouida SillsFagan notified,patient placed on 100% nonrebreather,sat's now 96 percent.Will continue to monitor patient.

## 2013-08-15 NOTE — Progress Notes (Signed)
PT Cancellation Note  Patient Details Name: [name] Young MRN: [id number] DOB: [birth year]   Cancelled Treatment:    Reason Eval/Treat Not Completed: Medical issues which prohibited therapy Pt is in significant respiratory distress and is being transferred to ICU.  Please reconsult when PT becomes appropriate.  Myrlene BrokerBrown, [name] 08/15/2013, 1:24 PM

## 2013-08-16 LAB — BASIC METABOLIC PANEL
BUN: 18 mg/dL (ref 6–23)
CALCIUM: 9.6 mg/dL (ref 8.4–10.5)
CO2: 30 mEq/L (ref 19–32)
Chloride: 101 mEq/L (ref 96–112)
Creatinine, Ser: 0.65 mg/dL (ref 0.50–1.10)
GFR calc Af Amer: >90 mL/min (ref >90)
GFR, EST NON AFRICAN AMERICAN: 79 mL/min — AB (ref >90)
Glucose, Bld: 114 mg/dL — ABNORMAL HIGH (ref 70–99)
Potassium: 2.9 mEq/L — CL (ref 3.7–5.3)
SODIUM: 151 meq/L — AB (ref 137–147)

## 2013-08-16 LAB — URINE CULTURE
COLONY COUNT: NO GROWTH
CULTURE: NO GROWTH

## 2013-08-16 MED ORDER — FLUMAZENIL 0.5 MG/5ML IV SOLN
0.2000 mg | Freq: Once | INTRAVENOUS | Status: AC
  Administered 2013-08-16: 0.2 mg via INTRAVENOUS
  Filled 2013-08-16: qty 5

## 2013-08-16 MED ORDER — POTASSIUM CHLORIDE 10 MEQ/100ML IV SOLN
10.0000 meq | INTRAVENOUS | Status: AC
  Administered 2013-08-16 (×4): 10 meq via INTRAVENOUS
  Filled 2013-08-16: qty 100

## 2013-08-16 MED ORDER — FUROSEMIDE 10 MG/ML IJ SOLN
40.0000 mg | Freq: Two times a day (BID) | INTRAMUSCULAR | Status: DC
  Administered 2013-08-16 – 2013-08-17 (×2): 40 mg via INTRAVENOUS
  Filled 2013-08-16 (×2): qty 4

## 2013-08-16 NOTE — Progress Notes (Signed)
PT Cancellation Note  Patient Details Name: [name] SartoriusCarolyn W [name] MRN: [id number] DOB: [birth year]   Cancelled Treatment:    Reason Eval/Treat Not Completed: Patient's level of consciousness RN stated that pt had to be medicated with Ativan recently due to agitation.  We will have to try to do PT eval tomorrow.  Konrad PentaBrown, Graviel Payeur L 08/16/2013, 4:26 PM

## 2013-08-16 NOTE — Progress Notes (Signed)
ROMAZICON 0.2MG  IV GIVEN AT 1212. PT AROUSED BRIEFLY FOR 10 MIN ATTEMPTING TO CLIMB OOB. REFUSED TO EAT.Marland Kitchen. THEN WENT BACK TO SLEEP.

## 2013-08-16 NOTE — Progress Notes (Signed)
Pt removed from BIPAP and placed on 3L nasal cannula.  O2 sat 100%. Pt tolerating well at this time.  RT will continue to monitor.

## 2013-08-16 NOTE — Care Management Note (Addendum)
° ° °  Page 1 of 2   08/22/2013     11:47:57 AM   CARE MANAGEMENT NOTE 08/22/2013  Patient:  [name] [name],[name] [name]   Account Number:  [id number]  Date Initiated:  08/16/2013  Documentation initiated by:  Sharrie RothmanBLACKWELL,Davis Ambrosini C  Subjective/Objective Assessment:   [facility] with COPD exacerbation. Pt may need higher level of care at discharge.     Action/Plan:   PT consult for possible need for higher level of care at discharge. CSw to arrange discharge to appropriate facility when medically stable.   Anticipated DC Date:  08/19/2013   Anticipated DC Plan:  SKILLED NURSING FACILITY  In-house referral  Clinical Social Worker      DC Planning Services  CM consult      [facility]  DURABLE MEDICAL EQUIPMENT   Choice offered to / List presented to:  C-4 Adult Children   DME arranged  [facility]      [facility]        Status of service:  Completed, signed off Medicare Important Message given?  YES (If response is NO, the following Medicare IM given date fields will be blank) Date Medicare IM given:  08/22/2013 Date Additional Medicare IM given:    Discharge Disposition:  ASSISTED LIVING  Per UR Regulation:    If discussed at Long Length of Stay Meetings, dates discussed:    Comments:  08/22/13 1145 [name] Queenammy Gabi Mcfate, RN BSN CM Pt discharged back to [address] today [facility]. [employer] to deliver portable tank to pts room prior to discharge and will deliver concentrator to pts ALF. Pt to receive inhouse HH. CSW to arrange discharge to the facility.  08/16/13 1400 [name] Queenammy Teyana Pierron, RN BSN CM

## 2013-08-16 NOTE — Progress Notes (Signed)
NAMTana Coast:  [name], [name]             ACCOUNT NO.:  [id number]  MEDICAL RECORD NO.:  [id number]  LOCATION:  A341                          FACILITY:  [facility]  PHYSICIAN:  Kingsley Callanderoy O. Ouida SillsFagan, MD       DATE OF BIRTH:  1927/05/12  DATE OF PROCEDURE:  08/15/2013 DATE OF DISCHARGE:                                PROGRESS NOTE   SUBJECTIVE:  [name] [name] was admitted yesterday after she presented to the emergency room following an episode of unresponsiveness at [facility].  She had been walking and slumped to the floor and was subsequently unresponsive.  She had recently been treated for bronchitis with Ceftin. Her CT scan of the head revealed no acute abnormality.  This morning, she is back to her baseline.  Neurologically, she is alert.  She was unclear that she was in the hospital.  She knows me.  She has had recent cough.  Chest x-ray yesterday revealed evidence of pulmonary edema.  She had an ABG revealed a pH of 7.30, with a pCO2 of 53, and a pO2 of 352. Evidently on non-rebreather.  Her lactic acid level was elevated at 4.57.  She had a troponin I of 0.06.  She had a D-dimer of 1.32.  She is anticoagulated with  Pradaxa because of chronic atrial fibrillation. Her EKG reveals atrial fibrillation with a rapid ventricular response with no definite acute ischemic changes.  Her urinalysis revealed many bacteria with 0-2 red cells and a negative leukocyte esterase.  PHYSICAL EXAMINATION:  VITAL SIGNS:  She has had temperatures ranging from 97.7-99.3, pulse 76, respirations 26, blood pressure 143/87, oxygen saturation is 93% on nasal cannula at 4 L. LUNGS:  Bilateral rhonchi. HEART:  Irregularly irregular. ABDOMEN:  Soft and nontender. NEURO:  At baseline.  IMPRESSION/PLAN: 1. Pulmonary edema, check proBNP, IV Lasix this morning. 2. Chronic atrial fibrillation, rate is controlled, on metoprolol.     Continue current anticoagulation. 3. Hypokalemia, resolved.  Potassium has improved from  2.9-3.7. 4. Hyperglycemia.  Glucose was 218 yesterday, but was 148 this     morning. 5. Chronic obstructive pulmonary disease exacerbation.  Continue     antibiotic therapy.  She was started on ceftriaxone in place of     cefuroxime yesterday. Her white count is 13.3. 6. Dementia. Continue with donepezil.    Kingsley Callanderoy O. Ouida SillsFagan, MD    ROF/MEDQ  D:  08/15/2013  T:  08/15/2013  Job:  [id number]

## 2013-08-17 DIAGNOSIS — I059 Rheumatic mitral valve disease, unspecified: Secondary | ICD-10-CM

## 2013-08-17 LAB — BASIC METABOLIC PANEL
BUN: 22 mg/dL (ref 6–23)
CO2: 35 mEq/L — ABNORMAL HIGH (ref 19–32)
Calcium: 9.5 mg/dL (ref 8.4–10.5)
Chloride: 99 mEq/L (ref 96–112)
Creatinine, Ser: 0.69 mg/dL (ref 0.50–1.10)
GFR, EST NON AFRICAN AMERICAN: 78 mL/min — AB (ref >90)
Glucose, Bld: 99 mg/dL (ref 70–99)
Potassium: 2.8 mEq/L — CL (ref 3.7–5.3)
Sodium: 153 mEq/L — ABNORMAL HIGH (ref 137–147)

## 2013-08-17 MED ORDER — FUROSEMIDE 40 MG PO TABS
40.0000 mg | ORAL_TABLET | Freq: Two times a day (BID) | ORAL | Status: DC
  Administered 2013-08-17 (×2): 40 mg via ORAL
  Filled 2013-08-17 (×2): qty 1

## 2013-08-17 MED ORDER — POTASSIUM CHLORIDE CRYS ER 20 MEQ PO TBCR
40.0000 meq | EXTENDED_RELEASE_TABLET | Freq: Three times a day (TID) | ORAL | Status: DC
  Administered 2013-08-17 (×3): 40 meq via ORAL
  Filled 2013-08-17 (×3): qty 2

## 2013-08-17 MED ORDER — AZITHROMYCIN 250 MG PO TABS
250.0000 mg | ORAL_TABLET | Freq: Every day | ORAL | Status: AC
  Administered 2013-08-17 – 2013-08-19 (×3): 250 mg via ORAL
  Filled 2013-08-17 (×4): qty 1

## 2013-08-17 NOTE — Progress Notes (Signed)
PT Cancellation Note  Patient Details Name: [name] SartoriusCarolyn W Young MRN: [id number] DOB: 1927/07/11   Cancelled Treatment:     Pt has eyes open but will not respond to command.  Asked pt her name with no reply.  Attempted to have pt lift her arm with no response; attempted to have pt move her leg out to the side with no response.  Will attempt evaluation at a later date although I am unsure of pt previous state; it may be that she is not a candidate for therapy.   Kingstyn Deruiter,CINDY 08/17/2013, 10:46 AM

## 2013-08-17 NOTE — Progress Notes (Signed)
K of 2.8 called by lab this am at 0630 will notify MD

## 2013-08-17 NOTE — Progress Notes (Signed)
NAMTana Young:  [name] Young             ACCOUNT NO.:  [id number]  MEDICAL RECORD NO.:  [id number]  LOCATION:  IC10                          FACILITY:  [facility]  PHYSICIAN:  Kingsley Callanderoy O. Ouida SillsFagan, MD       DATE OF BIRTH:  27-Jul-1927  DATE OF PROCEDURE:  08/16/2013 DATE OF DISCHARGE:                                PROGRESS NOTE   SUBJECTIVE:  [name] had increased difficulty breathing yesterday and was transferred to the ICU and was treated with BiPAP overnight. She is oxygenating well and is being converted from BiPAP to nasal cannula this morning.  She had an arterial blood gas yesterday afternoon, revealing a pH of 7.28 with a pCO2 of 58 and a pO2 of 164 on BiPAP.  She had agitation, requiring treatment with Ativan.  She is sedated this morning.  She appears to be breathing comfortably, however. She has had a urine output of over 2600 mL.  PHYSICAL EXAMINATION:  VITAL SIGNS:  Temperature 97.8, pulse 99, respirations 13, blood pressure 145/81, oxygen saturation 98%. LUNGS:  Diminished breath sounds. HEART:  Irregularly irregular. ABDOMEN:  Soft and nontender. EXTREMITIES:  No edema.  IMPRESSION/PLAN: 1. Congestive heart failure.  Her echo in 2010, revealed normal left     ventricular function with an ejection fraction of 60% to 65%.     There were no wall motion abnormalities at that time.  An echo will     be repeated.  This may likely be an acute on chronic diastolic     heart failure occurrence. 2. Hypokalemia.  Serum potassium has dropped from 3.7 to 2.9.  She     will be supplemented with additional doses of intravenous and oral     potassium. 3. Hypernatremia.  Serum sodium is 151. 4. Dementia.  No agitation at the present time. 5. Chronic obstructive pulmonary disease.  No wheezing noted.     Kingsley Callanderoy O. Ouida SillsFagan, MD     ROF/MEDQ  D:  08/16/2013  T:  08/16/2013  Job:  [id number]

## 2013-08-17 NOTE — Progress Notes (Signed)
*  PRELIMINARY RESULTS* Echocardiogram 2D Echocardiogram has been performed.  Tamarcus Condie 08/17/2013, 9:49 AM

## 2013-08-17 NOTE — Clinical Social Work Note (Signed)
CSW updated [address] [facility] and requested facility to assess pt to determine if she will need a higher level of care. Pt has not been able to participate with therapy at this point. Facility plans to come in AM. CSW will follow up.  Derenda FennelKara Kinze Labo, [place] [phone]

## 2013-08-18 LAB — BASIC METABOLIC PANEL
BUN: 32 mg/dL — ABNORMAL HIGH (ref 6–23)
CO2: 35 mEq/L — ABNORMAL HIGH (ref 19–32)
Calcium: 10 mg/dL (ref 8.4–10.5)
Chloride: 108 mEq/L (ref 96–112)
Creatinine, Ser: 0.82 mg/dL (ref 0.50–1.10)
GFR calc Af Amer: 74 mL/min — ABNORMAL LOW (ref >90)
GFR, EST NON AFRICAN AMERICAN: 64 mL/min — AB (ref >90)
Glucose, Bld: 114 mg/dL — ABNORMAL HIGH (ref 70–99)
Potassium: 4 mEq/L (ref 3.7–5.3)
SODIUM: 158 meq/L — AB (ref 137–147)

## 2013-08-18 LAB — CBC
HCT: 40.6 % (ref 36.0–46.0)
Hemoglobin: 13.3 g/dL (ref 12.0–15.0)
MCH: 36 pg — ABNORMAL HIGH (ref 26.0–34.0)
MCHC: 32.8 g/dL (ref 30.0–36.0)
MCV: 110 fL — ABNORMAL HIGH (ref 78.0–100.0)
Platelets: 291 10*3/uL (ref 150–400)
RBC: 3.69 MIL/uL — ABNORMAL LOW (ref 3.87–5.11)
RDW: 13.2 % (ref 11.5–15.5)
WBC: 7.4 10*3/uL (ref 4.0–10.5)

## 2013-08-18 MED ORDER — ALBUTEROL SULFATE (2.5 MG/3ML) 0.083% IN NEBU
INHALATION_SOLUTION | RESPIRATORY_TRACT | Status: AC
  Administered 2013-08-18: 2.5 mg
  Filled 2013-08-18: qty 3

## 2013-08-18 MED ORDER — DEXTROSE 5 % IV SOLN
INTRAVENOUS | Status: DC
  Administered 2013-08-18 – 2013-08-19 (×2): via INTRAVENOUS

## 2013-08-18 MED ORDER — ALBUTEROL SULFATE (2.5 MG/3ML) 0.083% IN NEBU
2.5000 mg | INHALATION_SOLUTION | RESPIRATORY_TRACT | Status: DC | PRN
  Filled 2013-08-18: qty 3

## 2013-08-18 NOTE — Progress Notes (Signed)
Report called to L.Covingtom,RN. Will transfer to 313 via wheelchair.

## 2013-08-18 NOTE — Clinical Social Work Note (Signed)
Facility assessed pt this morning and requested to have update after PT today. Pt not able to work with PT today and is not appropriate for further PT per note. Called French Anaracy at [facility] who is aware that pt got up to chair yesterday and spent most of the day up. She was also able to ambulate to commode. French Anaracy reports that pt would have long periods of sleeping in the past as well. They are agreeable to return at this point. Spoke with pt's niece Clydie BraunKaren who states that [facility] long as [facility] that is where family would like her to go. CSW will continue to follow.  Derenda FennelKara Miliani Deike, [place] [phone]

## 2013-08-18 NOTE — Progress Notes (Signed)
NAMBarbaraann Boys:  [place], [name]             ACCOUNT NO.:  [id number]  MEDICAL RECORD NO.:  [id number]  LOCATION:                                 FACILITY:  PHYSICIAN:  Kingsley Callanderoy O. Ouida SillsFagan, MD       DATE OF BIRTH:  [birth year]  DATE OF PROCEDURE:  08/17/2013 DATE OF DISCHARGE:                                PROGRESS NOTE   SUBJECTIVE:  [name] is much more alert this morning.  She is not back to her baseline level of alertness yet, however, she has not had fever.  OBJECTIVE:  VITAL SIGNS:  Temperature is 98, pulse 81 respirations 17, blood pressure 133/87. GENERAL:  IV access was lost overnight and 3 unsuccessful attempts were made to restart it.  She appears to be breathing comfortably. LUNGS:  Revealed diminished but clear breath sounds. HEART:  Irregularly irregular. ABDOMEN:  Soft and nontender. EXTREMITIES:  Reveal no edema.  IMPRESSION/PLAN: 1. Congestive heart failure likely acute on chronic diastolic heart     failure.  Her proBNP was [id number].  Previous LV function five years     ago by echo was normal.  New echocardiogram pending.  We will     convert her Lasix to the oral route since IV accesses has been     lost. 2. Hypokalemia.  Serum potassium is dropped to 2.8.  We will     supplement orally. 3. Hyperglycemia.  Glucose has normalized to 99. 4. Hypernatremia.  Serum sodium has risen to 153. 5. Chronic obstructive pulmonary disease and acute respiratory     failure, much improved.  No definite sign of underlying infection.     We would discontinue IV antibiotic therapy, but will continue oral     azithromycin for now.  We will recheck her CBC. 6. Chronic atrial fibrillation.  Rate control is satisfactory on     Toprol-XL and digoxin. 7. Dementia.  Continue donepezil.  We will avoid any further Ativan     doses since this seemed to significantly sedate her.  She was given     a dose of Romazicon yesterday and had a brief level of improvement     in her level of alertness. 8.  Anticoagulation.  Continue Pradaxa.    Kingsley Callanderoy O. Ouida SillsFagan, MD    ROF/MEDQ  D:  08/17/2013  T:  08/17/2013  Job:  [id number]

## 2013-08-18 NOTE — Progress Notes (Signed)
Patient given thin liquids and immediately coughed. Able to cough contents back up. Diet changed to reflect patient needs.

## 2013-08-18 NOTE — Progress Notes (Signed)
PT Cancellation Note  Patient Details Name: [name] SartoriusCarolyn W Young MRN: [id number] DOB: 02-16-1927   Cancelled Treatment:    Reason Eval/Treat Not Completed: Patient's level of consciousness Pt will arouse for a moment but then returns back to sleep.  She is actively moving in the bed and was found this morning curled up in a little ball.  I attempted to reposition her but she resisted all attempts. Per nursing, she was able to transfer to a chair yesterday and was up most of the day.  They were able to walk her to the commode with assist of 2.  Pt does not appear to be appropriate for PT at this time.  If her level of consciousness improves enough, please reconsult.  Myrlene BrokerBrown, Afreen Siebels L 08/18/2013, 10:05 AM

## 2013-08-19 LAB — BASIC METABOLIC PANEL
BUN: 42 mg/dL — ABNORMAL HIGH (ref 6–23)
CHLORIDE: 107 meq/L (ref 96–112)
CO2: 38 mEq/L — ABNORMAL HIGH (ref 19–32)
Calcium: 9.6 mg/dL (ref 8.4–10.5)
Creatinine, Ser: 1.15 mg/dL — ABNORMAL HIGH (ref 0.50–1.10)
GFR calc Af Amer: 49 mL/min — ABNORMAL LOW (ref >90)
GFR calc non Af Amer: 42 mL/min — ABNORMAL LOW (ref >90)
Glucose, Bld: 145 mg/dL — ABNORMAL HIGH (ref 70–99)
POTASSIUM: 4 meq/L (ref 3.7–5.3)
SODIUM: 156 meq/L — AB (ref 137–147)

## 2013-08-19 MED ORDER — SODIUM CHLORIDE 0.45 % IV SOLN
INTRAVENOUS | Status: DC
  Administered 2013-08-19 – 2013-08-21 (×3): via INTRAVENOUS

## 2013-08-19 MED ORDER — METOPROLOL TARTRATE 50 MG PO TABS
50.0000 mg | ORAL_TABLET | Freq: Two times a day (BID) | ORAL | Status: DC
  Administered 2013-08-19 – 2013-08-21 (×5): 50 mg via ORAL
  Filled 2013-08-19 (×7): qty 2

## 2013-08-19 MED ORDER — BIOTENE DRY MOUTH MT LIQD
15.0000 mL | Freq: Two times a day (BID) | OROMUCOSAL | Status: DC
  Administered 2013-08-19 – 2013-08-22 (×7): 15 mL via OROMUCOSAL

## 2013-08-19 NOTE — Progress Notes (Signed)
UR chart review completed.  

## 2013-08-19 NOTE — Clinical Social Work Note (Signed)
CSW updated [address] [facility] pt. Facility can accept pt over weekend if stable. If continues to require oxygen, facility requests [employer]. CSW to continue to follow.  Derenda FennelKara Emaan Gary, [place] [phone]

## 2013-08-19 NOTE — Evaluation (Signed)
Clinical/Bedside Swallow Evaluation Patient Details  Name: [name] SartoriusCarolyn W Young MRN: [id number] Date of Birth: 02-03-1927  Today's Date: 08/19/2013 Time: 0905-0925 SLP Time Calculation (min): 20 min  Past Medical History:  Past Medical History  Diagnosis Date  . Hypertension   . Osteoporosis   . Zenker's diverticulum     [facility]  . Tobacco abuse     40 pack year history discontinued in 1992  . TIA (transient ischemic attack)     Occurred post THA  . Atrial fibrillation 2005    Echocardiogram in 2005-mild LVH; minimal AF; normal EF  . Chronic anticoagulation 2005  . COPD (chronic obstructive pulmonary disease)   . Alzheimer disease   . Scoliosis   . Aortic stenosis   . B12 deficiency    Past Surgical History:  Past Surgical History  Procedure Laterality Date  . Tonsillectomy    . Thalamotomy  1997  . Dilation and curettage, diagnostic / therapeutic  1960  . Cataract extraction, bilateral    . Zenker's diverticulectomy  2006    [facility]   HPI:  Pt is an 80 y/o lady who was living at [facility]. She recently had a fall and change in mental status.    Assessment / Plan / Recommendation Clinical Impression  Pt seen at bedside today to assess swallow function. Initially, pt was in fetal position, towards the bottom of the bed. After multiple attempts, SLP was able to convince pt to become somewhat upright for PO intake. SLP initially attempted water via spoon, though pt could not hold in mouth due to positioning. Given straw, pt had difficulty sucking liquid up to mouth, so SLP presented water through bottom of straw. Pt swallowed 10+ sips using this method without overt s/sx of aspiration and/or penetration. She opened mouth widely before each presentation in anticipation of more liquid. Laryngeal elevation was weakened as palpated digitally. Given tsp presentations of puree, pt was able to consume ~6-8 bites without overt s/sx of aspiration.  However, she required tactile and verbal cueing to swallow bolus before opening mouth to take in more. She responded well to "close your mouth and swallow," with firm  pressure beneath chin to initiate swallow. Due to results of this BSE, SLP recommends pureed diet with thin liquids. Pt requires one to one feeding assistance with strict aspiration precautions and max cueing for safety.     Aspiration Risk  Mild    Diet Recommendation Dysphagia 1 (Puree);Thin liquid   Liquid Administration via: Straw Medication Administration: Crushed with puree Supervision: Staff to assist with self feeding;Full supervision/cueing for compensatory strategies Compensations: Slow rate;Small sips/bites;Check for pocketing;Clear throat intermittently Postural Changes and/or Swallow Maneuvers: Seated upright 90 degrees;Upright 30-60 min after meal    Other  Recommendations Oral Care Recommendations: Oral care BID   Follow Up Recommendations    SLP to f/u for diet tolerance and management.    Frequency and Duration min 2x/week  2 weeks   Pertinent Vitals/Pain Pt did not report pain, though she was nonverbal during evaluation.    SLP Swallow Goals     Swallow Study Prior Functional Status  Type of Home: Assisted living Available Help at Discharge: Available 24 hours/day    General HPI: Pt is an 80 y/o lady who [facility]. She recently had a fall and change in mental status.     Oral/Motor/Sensory Function Overall Oral Motor/Sensory Function: Other (comment) (Difficult to assess d/t pt not following  directions.)   Ice Chips Ice chips: Not tested   Thin Liquid Thin Liquid: Within functional limits Presentation: Straw Other Comments: Pt was inclined, not quite at 90 degrees d/t refusal. She was not able to suck through straw, but took water through bottom of straw with SLP holding it in with finger. She demonstrated no overt s/sx of aspiration during 10+ trials.     Nectar Thick  Nectar Thick Liquid: Not tested   Honey Thick Honey Thick Liquid: Not tested   Puree Puree: Within functional limits Presentation: Spoon   Solid   GO    Solid: Not tested       Dalana Pfahler S 08/19/2013,10:37 AM

## 2013-08-19 NOTE — Progress Notes (Signed)
NAMBarbaraann Boys:  Young, [name] Young             ACCOUNT NO.:  [id number]  MEDICAL RECORD NO.:  [id number]  LOCATION:  A313                          FACILITY:  [facility]  PHYSICIAN:  Kingsley Callanderoy O. Ouida SillsFagan, MD       DATE OF BIRTH:  31-May-1927  DATE OF PROCEDURE:  08/18/2013 DATE OF DISCHARGE:                                PROGRESS NOTE   SUBJECTIVE:  [name] is having no difficulty breathing.  She appears comfortable.  She is intermittently more alert.  She is arousable now and will speak.  She complained of the stethoscope being cold.  She would not answer questions however.  She has had no fever.  OBJECTIVE:  VITAL SIGNS:  Temperature 97.1, pulse 90, respirations 16, blood pressure 146/75. LUNGS:  Clear. HEART:  Irregularly irregular. ABDOMEN:  Soft, nontender.  IMPRESSION/PLAN: 1. Pulmonary edema, resolved.  Echocardiogram reveals a left     ventricular ejection fraction of 65-70% with no wall motion     abnormalities.  She is therefore felt to have had acute-on-chronic     diastolic heart failure. 2. Chronic atrial fibrillation, rate is satisfactorily controlled at     present.  Continue Pradaxa. 3. Hypernatremia.  Serum sodium increased to 158.  We will hold Lasix     and start D5W.  She is taking liquids poorly. 4. Hypokalemia.  Serum potassium is normalized to 4.0. 5. Leukocytosis, resolved.  White count is dropped from 13.3-7.4. 6. Dementia, donepezil is likely minimal at this point, will     discontinue.  We moved from the ICU to med/surg bed today.     Kingsley Callanderoy O. Ouida SillsFagan, MD     ROF/MEDQ  D:  08/18/2013  T:  08/19/2013  Job:  [id number]

## 2013-08-19 NOTE — Progress Notes (Signed)
08/19/13 1304 Patient alert, awake this afternoon. Confused, but responding appropriately. Repositioned sitting up at 90 degrees per ST recommendations. Pt tolerated medications crushed in applesauce well with no complaints. Some coughing noted after liquids. Nursing staff assistance required for meals. Aspiration precautions in place. [name] RegalAshley Persephanie Laatsch, RN

## 2013-08-20 LAB — BASIC METABOLIC PANEL
BUN: 36 mg/dL — ABNORMAL HIGH (ref 6–23)
CHLORIDE: 106 meq/L (ref 96–112)
CO2: 33 meq/L — AB (ref 19–32)
Calcium: 8.9 mg/dL (ref 8.4–10.5)
Creatinine, Ser: 0.88 mg/dL (ref 0.50–1.10)
GFR calc Af Amer: 68 mL/min — ABNORMAL LOW (ref >90)
GFR calc non Af Amer: 59 mL/min — ABNORMAL LOW (ref >90)
Glucose, Bld: 121 mg/dL — ABNORMAL HIGH (ref 70–99)
Potassium: 3.9 mEq/L (ref 3.7–5.3)
Sodium: 149 mEq/L — ABNORMAL HIGH (ref 137–147)

## 2013-08-20 NOTE — Progress Notes (Signed)
NAMBarbaraann Boys:  [place], Noble             ACCOUNT NO.:  [id number]  MEDICAL RECORD NO.:  [id number]  LOCATION:  A313                          FACILITY:  [facility]  PHYSICIAN:  Kingsley Callanderoy O. Ouida SillsFagan, MD       DATE OF BIRTH:  1926/10/17  DATE OF PROCEDURE:  08/19/2013 DATE OF DISCHARGE:                                PROGRESS NOTE   Ms. Prell opens her eyes, but is not verbally responsive this morning. She appears to be breathing comfortably.  She was eating better yesterday.  She has been treated with D5W for hypernatremia.  OBJECTIVE:  VITAL SIGNS:  Temperature 97.4, pulse 79, respirations 16, blood pressure 131/83, oxygen saturation 92%. LUNGS:  Clear. HEART:  Irregularly irregular. ABDOMEN:  Nontender. EXTREMITIES:  No edema. NEUROLOGIC:  Minimally responsive and nonverbal.  IMPRESSION/PLAN: 1. Hypernatremia.  Serum sodium has dropped slightly to 156.  Her BUN     and creatinine are up to 42 and 1.15.  Modify her IV fluids to half-     normal saline at 75 mL an hour.  She is not showing signs of volume     overload. 2. Acute on chronic diastolic heart failure.  Continue holding Lasix. 3. Hyperglycemia.  Glucose is 145.  We will remove dextrose from her     fluids. 4. Dementia.  Overall condition at this point is slow to improve.     Level of alertness is still not back to baseline. 5. Chronic atrial fibrillation.  Continue metoprolol and Pradaxa.     Kingsley Callanderoy O. Ouida SillsFagan, MD     ROF/MEDQ  D:  08/19/2013  T:  08/20/2013  Job:  [id number]

## 2013-08-20 NOTE — Progress Notes (Signed)
NAMBarbaraann [name]:  [name], [name]             ACCOUNT NO.:  [id number]  MEDICAL RECORD NO.:  [id number]  LOCATION:  A313                          FACILITY:  [facility]  PHYSICIAN:  Kingsley Callanderoy O. Ouida SillsFagan, MD       DATE OF BIRTH:  May 19, 1927  DATE OF PROCEDURE: DATE OF DISCHARGE:                                PROGRESS NOTE   SUBJECTIVE:  [name] is awake and alert this morning.  She is not speaking very much and will not answer questions.  She does clearly though look more alert.  She has been hydrated with half normal saline for hypernatremia.  She has been able to eat a dysphagia 1 diet with assistance from the nursing staff.  OBJECTIVE:  VITAL SIGNS:  Temperature 97.6, pulse 91, respirations 20, blood pressure 138/69. LUNGS:  Revealed diminished but clear breath sounds. HEART:  Irregularly irregular. ABDOMEN:  Soft and nontender.  IMPRESSION/PLAN: 1. Hypernatremia.  Serum sodium is down to 149.  We will continue half     normal saline, but we will reduce the rate to 50 mL/hour.  She     shows no sign of volume overload at this point. 2. Acute on chronic diastolic heart failure.  Continue holding Lasix. 3. Dementia.  Mental status has still not returned to baseline.     Kingsley Callanderoy O. Ouida SillsFagan, MD     ROF/MEDQ  D:  08/20/2013  T:  08/20/2013  Job:  [id number]

## 2013-08-21 LAB — BASIC METABOLIC PANEL
BUN: 27 mg/dL — ABNORMAL HIGH (ref 6–23)
CO2: 30 mEq/L (ref 19–32)
Calcium: 8.8 mg/dL (ref 8.4–10.5)
Chloride: 103 mEq/L (ref 96–112)
Creatinine, Ser: 0.75 mg/dL (ref 0.50–1.10)
GFR calc Af Amer: 88 mL/min — ABNORMAL LOW (ref >90)
GFR, EST NON AFRICAN AMERICAN: 76 mL/min — AB (ref >90)
GLUCOSE: 98 mg/dL (ref 70–99)
POTASSIUM: 3.3 meq/L — AB (ref 3.7–5.3)
Sodium: 146 mEq/L (ref 137–147)

## 2013-08-21 MED ORDER — POTASSIUM CHLORIDE 20 MEQ/15ML (10%) PO LIQD
20.0000 meq | Freq: Three times a day (TID) | ORAL | Status: DC
  Administered 2013-08-21 – 2013-08-22 (×4): 20 meq via ORAL
  Filled 2013-08-21 (×4): qty 30

## 2013-08-21 NOTE — Plan of Care (Signed)
Problem: Phase I Progression Outcomes Goal: OOB as tolerated unless otherwise ordered Outcome: Progressing 08/21/13 1243 Patient assisted up to chair, chair alarm on for safety. Earnstine RegalAshley Doninique Lwin, RN

## 2013-08-21 NOTE — Progress Notes (Signed)
NAMBarbaraann Young:  Domingo, Luxe             ACCOUNT NO.:  [id number]  MEDICAL RECORD NO.:  [id number]  LOCATION:  A313                          FACILITY:  [facility]  PHYSICIAN:  Kingsley Callanderoy O. Ouida SillsFagan, MD       DATE OF BIRTH:  07-23-1927  DATE OF PROCEDURE:  08/21/2013 DATE OF DISCHARGE:                                PROGRESS NOTE   SUBJECTIVE:  Mrs. Bos is alert and breathing comfortably this morning.  She is speaking very little.  She has had some difficulty choking with a pureed diet.  OBJECTIVE:  VITAL SIGNS:  Temperature 97.5, pulse 100, respirations 18, blood pressure 99/65, and oxygen saturation 93%. LUNGS:  Clear.  She does have a congested sounding cough. HEART:  Irregularly irregular. ABDOMEN:  Soft and nontender. EXTREMITIES:  Show no edema.  IMPRESSION/PLAN: 1. Hypernatremia.  Serum sodium has improved to 146 with half normal     saline.  BUN and creatinine are down to 27 and 0.75.  She shows no     sign of volume overload.  Maintenance of adequate oral intake     unfortunately may prove to be difficult. 2. Hypokalemia.  Serum potassium has dropped to 3.3, we will     supplement orally. 3. Dementia.  Do not plan to proceed with tube feeding. 4. Acute on chronic diastolic heart failure, stable.  Continue holding     Lasix. 5. Malnutrition.  Recheck total protein and albumin levels. 6. Chronic atrial fibrillation.     Kingsley Callanderoy O. Ouida SillsFagan, MD     ROF/MEDQ  D:  08/21/2013  T:  08/21/2013  Job:  [id number]

## 2013-08-22 LAB — COMPREHENSIVE METABOLIC PANEL
ALBUMIN: 2.2 g/dL — AB (ref 3.5–5.2)
ALT: 11 U/L (ref 0–35)
AST: 20 U/L (ref 0–37)
Alkaline Phosphatase: 74 U/L (ref 39–117)
BUN: 24 mg/dL — ABNORMAL HIGH (ref 6–23)
CO2: 29 meq/L (ref 19–32)
CREATININE: 0.82 mg/dL (ref 0.50–1.10)
Calcium: 8.6 mg/dL (ref 8.4–10.5)
Chloride: 105 mEq/L (ref 96–112)
GFR calc Af Amer: 74 mL/min — ABNORMAL LOW (ref >90)
GFR calc non Af Amer: 64 mL/min — ABNORMAL LOW (ref >90)
Glucose, Bld: 115 mg/dL — ABNORMAL HIGH (ref 70–99)
Potassium: 3.8 mEq/L (ref 3.7–5.3)
Sodium: 144 mEq/L (ref 137–147)
Total Bilirubin: 0.7 mg/dL (ref 0.3–1.2)
Total Protein: 5.9 g/dL — ABNORMAL LOW (ref 6.0–8.3)

## 2013-08-22 MED ORDER — POTASSIUM CHLORIDE ER 10 MEQ PO TBCR: 20.0000 meq | EXTENDED_RELEASE_TABLET | Freq: Every day | ORAL | Status: DC

## 2013-08-22 MED ORDER — FUROSEMIDE 20 MG PO TABS: 20.0000 mg | ORAL_TABLET | Freq: Every day | ORAL | Status: DC

## 2013-08-22 NOTE — Progress Notes (Signed)
Notified MD of patient's B12 orders for discharge because the route of medication was unclear. MD was aware and stated that he would adjust the dose when patient comes to his office for a follow up appointment. Will continue to monitor the patient.

## 2013-08-22 NOTE — Discharge Summary (Signed)
NAMBarbaraann Young:  Zuckerman, Meia             ACCOUNT NO.:  [id number]  MEDICAL RECORD NO.:  [id number]  LOCATION:  A313                          FACILITY:  [facility]  PHYSICIAN:  Kingsley Callanderoy O. Ouida SillsFagan, MD       DATE OF BIRTH:  07/24/1927  DATE OF ADMISSION:  08/14/2013 DATE OF DISCHARGE:  LH                              DISCHARGE SUMMARY   DISCHARGE DIAGNOSES: 1. Acute-on-chronic diastolic congestive heart failure. 2. Hypernatremia. 3. Alzheimer disease. 4. Chronic atrial fibrillation. 5. Hypokalemia. 6. Malnutrition. 7. Chronic obstructive pulmonary disease.  HOSPITAL COURSE:  This patient is an 80 year old female resident of [address] [facility], who presented with change in mental status.  Evaluation in the emergency room revealed pulmonary edema on chest x-ray.  She has an atrial fibrillation with a rapid ventricular response.  A CT scan of the brain revealed atrophy and small vessel changes, but no acute abnormalities.  Her proBNP was [id number].  ABG revealed a pH of 7.30 with a pCO2 of 53 and a pO2 of 352.  She was treated initially with BiPAP in the ICU.  Her white count was 13.3.  She was treated with IV Lasix.  She responded well to diuresis.  She underwent an echocardiogram, which revealed a left ventricular ejection fraction of 65-70%.  There was moderate concentric hypertrophy.  There were no wall motion abnormalities.  She remained in atrial fibrillation.  Her rate was controlled with metoprolol and digoxin.  She was hypernatremic on admission with a serum sodium of 149.  With diuresis, her serum sodium increased to 158.  She required treatment with hypotonic fluids.  Her serum sodium has trended down over time to 144.  She has been treated for hypokalemia with supplemental potassium. BUN and creatinine increased with diuresis to 42 and 1.15, but have improved to 24 and 0.82 at discharge.  She is malnourished with an albumin of 2.2.  Her oral intake is spotty.  Her leukocytosis resolved.  She  was treated for cough and congestion with azithromycin.  She had been on cefuroxime prior to admission.  She had bacteria on her urinalysis initially, but her culture was negative.  A do not resuscitate order is in place in light of her dementia.  Her condition gradually improved and she was stable for discharge on the morning of the August 22, 2013.  She will be seen in followup in 1 week.  DISCHARGE MEDICATIONS:  Lasix 20 mg daily, potassium 20 mEq daily, multivitamin daily, digoxin 0.125 mg daily, Colace 100 mg b.i.d., Toprol- XL 100 mg daily, Protonix 40 mg daily, Pradaxa 150 mg b.i.d., vitamin B12 2500 mcg sublingual monthly.     Kingsley Callanderoy O. Ouida SillsFagan, MD     ROF/MEDQ  D:  08/22/2013  T:  08/22/2013  Job:  [id number]

## 2013-08-22 NOTE — Clinical Social Work Note (Signed)
[facility]. Family and facility aware and agreeable. Facility aware that MD plans to follow up regarding Vitamin B12 as orders were not clear and this was not put on FL2. Out of facility DNR in packet. D/C summary and FL2 faxed. Waiting on oxygen then facility will transport.  Derenda FennelKara Thad Osoria, [place] [phone]

## 2013-08-22 NOTE — Progress Notes (Addendum)
On 4L of O2 via [place] at rest pt is at 98% oxygen saturation   On RA at rest pt is at 96% oxygen saturation   Upon sitting on RA pt is at 92% oxygen saturation  Upon standing on RA pt is at 86% oxygen saturation  With activity (standing) on 4L via [place] pt's O2 came back up to 97% oxygen saturation  Returning to rest on 4L [place] pt is at 97% oxygen saturation  Will continue to monitor.

## 2013-08-22 NOTE — Progress Notes (Signed)
UR chart review completed.  

## 2013-08-25 ENCOUNTER — Emergency Department (HOSPITAL_COMMUNITY)
Admission: EM | Admit: 2013-08-25 | Discharge: 2013-08-25 | Disposition: A | Discharge status: Hospice | Payer: Medicare Other | Attending: Emergency Medicine | Admitting: Emergency Medicine

## 2013-08-25 ENCOUNTER — Encounter (HOSPITAL_COMMUNITY): Payer: Self-pay | Admitting: Emergency Medicine

## 2013-08-25 DIAGNOSIS — Z043 Encounter for examination and observation following other accident: Secondary | ICD-10-CM | POA: Insufficient documentation

## 2013-08-25 DIAGNOSIS — Y9389 Activity, other specified: Secondary | ICD-10-CM | POA: Insufficient documentation

## 2013-08-25 DIAGNOSIS — F0280 Dementia in other diseases classified elsewhere without behavioral disturbance: Secondary | ICD-10-CM | POA: Insufficient documentation

## 2013-08-25 DIAGNOSIS — G309 Alzheimer's disease, unspecified: Secondary | ICD-10-CM | POA: Insufficient documentation

## 2013-08-25 DIAGNOSIS — Z8739 Personal history of other diseases of the musculoskeletal system and connective tissue: Secondary | ICD-10-CM | POA: Insufficient documentation

## 2013-08-25 DIAGNOSIS — Z862 Personal history of diseases of the blood and blood-forming organs and certain disorders involving the immune mechanism: Secondary | ICD-10-CM | POA: Insufficient documentation

## 2013-08-25 DIAGNOSIS — Z87891 Personal history of nicotine dependence: Secondary | ICD-10-CM | POA: Insufficient documentation

## 2013-08-25 DIAGNOSIS — Y9210 Unspecified residential institution as the place of occurrence of the external cause: Secondary | ICD-10-CM | POA: Insufficient documentation

## 2013-08-25 DIAGNOSIS — I10 Essential (primary) hypertension: Secondary | ICD-10-CM | POA: Insufficient documentation

## 2013-08-25 DIAGNOSIS — R296 Repeated falls: Secondary | ICD-10-CM | POA: Insufficient documentation

## 2013-08-25 DIAGNOSIS — W19XXXA Unspecified fall, initial encounter: Secondary | ICD-10-CM

## 2013-08-25 DIAGNOSIS — Z8673 Personal history of transient ischemic attack (TIA), and cerebral infarction without residual deficits: Secondary | ICD-10-CM | POA: Insufficient documentation

## 2013-08-25 DIAGNOSIS — J449 Chronic obstructive pulmonary disease, unspecified: Secondary | ICD-10-CM | POA: Insufficient documentation

## 2013-08-25 DIAGNOSIS — Z8639 Personal history of other endocrine, nutritional and metabolic disease: Secondary | ICD-10-CM | POA: Insufficient documentation

## 2013-08-25 DIAGNOSIS — J4489 Other specified chronic obstructive pulmonary disease: Secondary | ICD-10-CM | POA: Insufficient documentation

## 2013-08-25 DIAGNOSIS — Z8719 Personal history of other diseases of the digestive system: Secondary | ICD-10-CM | POA: Insufficient documentation

## 2013-08-25 NOTE — ED Notes (Signed)
[facility] had no way to [facility] patient with 02.  EMS called and [facility]

## 2013-08-25 NOTE — Discharge Instructions (Signed)
Patient is not complaining of any pain. No x-rays taken.

## 2013-08-25 NOTE — ED Notes (Signed)
Patient asking to go home.  Advised we will be discharging her very soon.  [facility]ed [facility] about transport.  Med Tech to call me back and advise if they have means to transport patient on 02.  Patient must remain on continuous 02  -  Trial without 02 Sats drop to 78

## 2013-08-25 NOTE — ED Notes (Signed)
Dr Adriana Simasook at bedside to examine.  Patient has no complaints, denies pain.

## 2013-08-25 NOTE — ED Notes (Signed)
Patient presents to ER via [facility] fall [facility].  Patient has no complaints at this time.

## 2013-08-25 NOTE — ED Provider Notes (Signed)
CSN: [id number]     Arrival date & time 08/25/13  1924 History   First MD Initiated Contact with Patient 08/25/13 1929     Chief Complaint  Patient presents with  . Fall   (Consider location/radiation/quality/duration/timing/severity/associated sxs/prior Treatment) HPI..... level V caveat for dementia.    Patient allegedly squatted down with her back toward the wall.  She did not actually fall. She has no complaints of pain anywhere. She appears to be baseline. No fever or chills  Past Medical History  Diagnosis Date  . Hypertension   . Osteoporosis   . Zenker's diverticulum     [facility]  . Tobacco abuse     40 pack year history discontinued in 1992  . TIA (transient ischemic attack)     Occurred post THA  . Atrial fibrillation 2005    Echocardiogram in 2005-mild LVH; minimal AF; normal EF  . Chronic anticoagulation 2005  . COPD (chronic obstructive pulmonary disease)   . Alzheimer disease   . Scoliosis   . Aortic stenosis   . B12 deficiency    Past Surgical History  Procedure Laterality Date  . Tonsillectomy    . Thalamotomy  1997  . Dilation and curettage, diagnostic / therapeutic  1960  . Cataract extraction, bilateral    . Zenker's diverticulectomy  2006    [facility]   Family History  Problem Relation Age of Onset  . Pneumonia Father    History  Substance Use Topics  . Smoking status: Former Games developermoker  . Smokeless tobacco: Never Used  . Alcohol Use: No   OB History   Grav Para Term Preterm Abortions TAB SAB Ect Mult Living                 Review of Systems  Unable to perform ROS: Dementia    Allergies  Influenza virus vaccine split  Home Medications  No current outpatient prescriptions on file. BP 141/88  Pulse 77  Temp(Src) 98.9 F (37.2 C) (Oral)  Resp 18  Wt 80 lb (36.288 kg)  SpO2 98% Physical Exam  Nursing note and vitals reviewed. Constitutional:  Demented  HENT:  Head: Normocephalic and atraumatic.  No sign of  head injury  Eyes: Conjunctivae and EOM are normal. Pupils are equal, round, and reactive to light.  Neck: Normal range of motion. Neck supple.  Nontender  Cardiovascular: Normal rate, regular rhythm and normal heart sounds.   Pulmonary/Chest: Effort normal and breath sounds normal.  Abdominal: Soft. Bowel sounds are normal.  Musculoskeletal: Normal range of motion.  Neurological: She is alert.  Skin: Skin is warm and dry.  Psychiatric:  Flat affect    ED Course  Procedures (including critical care time) Labs Review Labs Reviewed - No data to display Imaging Review No results found.  EKG Interpretation   None       MDM   1. Fall    No head or neck.   No imaging necessary.  Patient appears to be at baseline   Donnetta HutchingBrian Solymar Grace, MD 08/25/13 2106

## 2013-08-25 NOTE — ED Notes (Signed)
Congested cough occasionally. Pt speech is not always clear.  States she used to work here at [employer].  Remains on 02 at 4 L per cannula.  [facility] to ask why all meds were discontinued today.  She states patient made DNR and placed on [facility] today -08/25/13

## 2013-09-04 DEATH — deceased

## 2013-09-12 NOTE — Discharge Summary (Signed)
NAMBarbaraann Boys:  Young, [name] Young             ACCOUNT NO.:  [id number]  MEDICAL RECORD NO.:  [id number]  LOCATION:                                 FACILITY:  PHYSICIAN:  Kingsley Callanderoy O. Ouida SillsFagan, MD       DATE OF BIRTH:  05-30-1927  DATE OF ADMISSION:  08/14/2013 DATE OF DISCHARGE:  01/19/2015LH                              DISCHARGE SUMMARY   ADDENDUM:  In response to a query from medical records, please note that Mrs. Gallop had altered mental status because of Alzheimer's disease and heart failure.     Kingsley Callanderoy O. Ouida SillsFagan, MD     ROF/MEDQ  D:  09/11/2013  T:  09/12/2013  Job:  [id number]

## 2014-06-13 IMAGING — CT CT HEAD W/O CM
1 of 2 series · 16 of 30 positions shown, 20 images · non-contrast
Comparison: 09/03/2011

CLINICAL DATA: Shortness of breath, altered mental status

EXAM:
CT HEAD WITHOUT CONTRAST
TECHNIQUE: Contiguous axial images were obtained from the base of the skull
through the vertex without intravenous contrast.

[Series 2: headtrauma 4.8 h37s · axial · 0.43mm/px · z∈[+41,+196]mm · 16 of 36 slices shown, 20 images]
[im 2/36  brain]
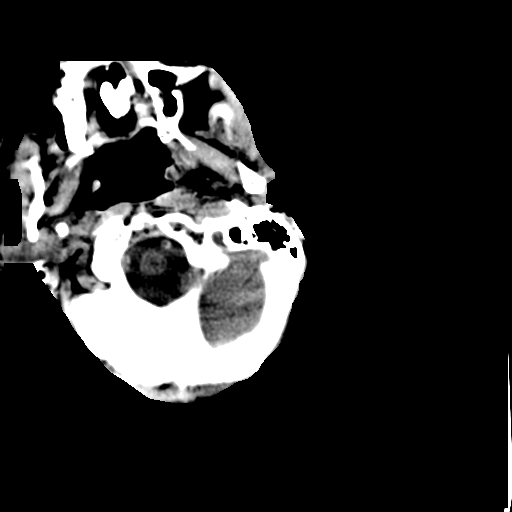
[im 2/36  bone]
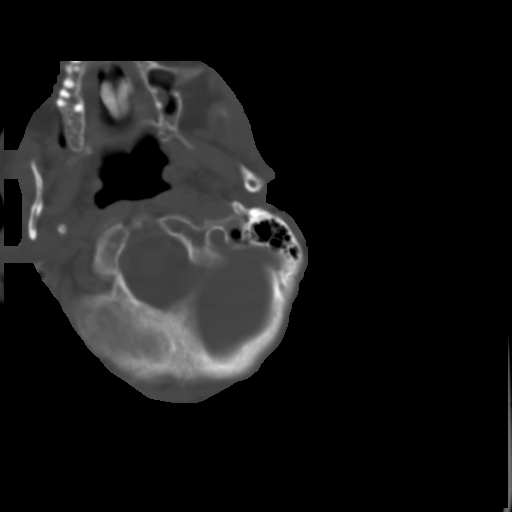
[im 4/36  brain]
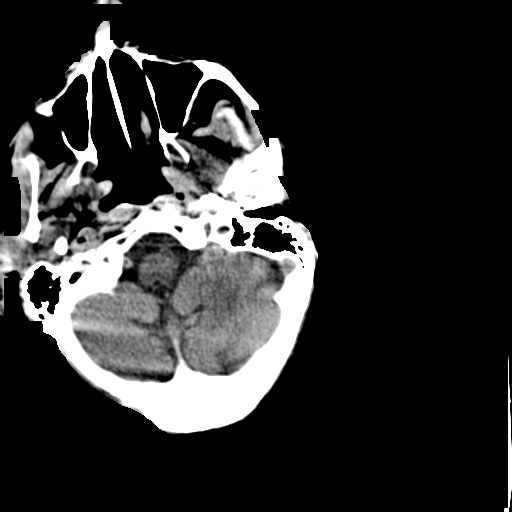
[im 7/36  brain]
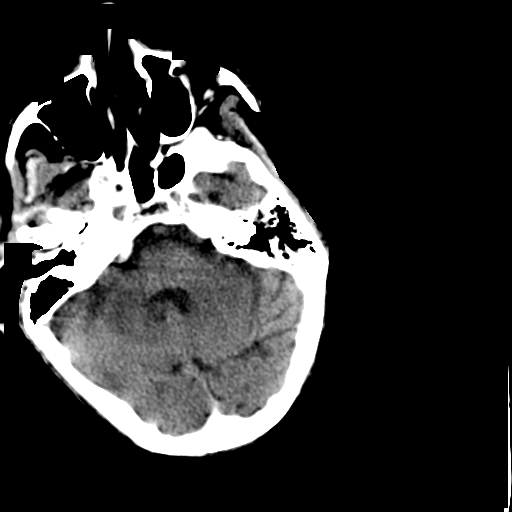
[im 9/36  brain]
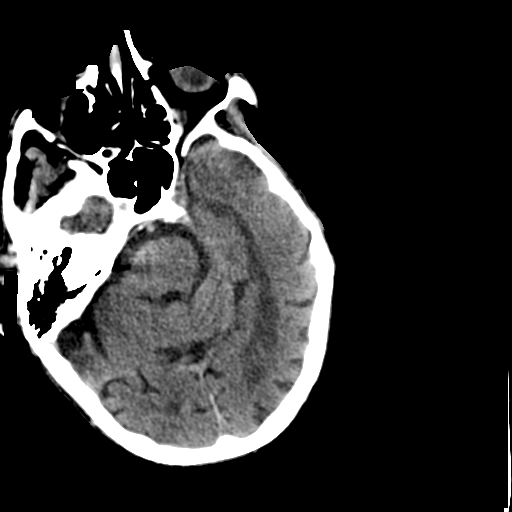
[im 11/36  brain]
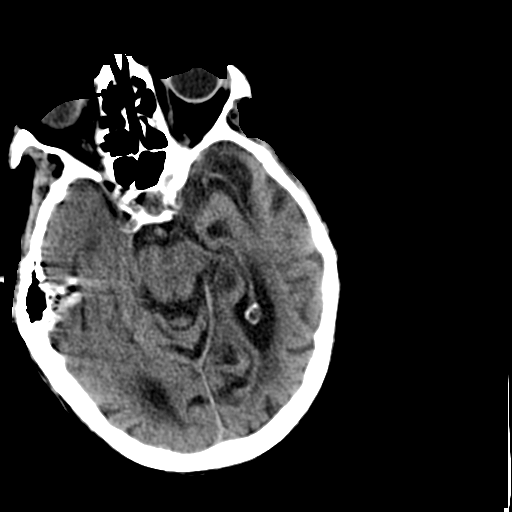
[im 11/36  bone]
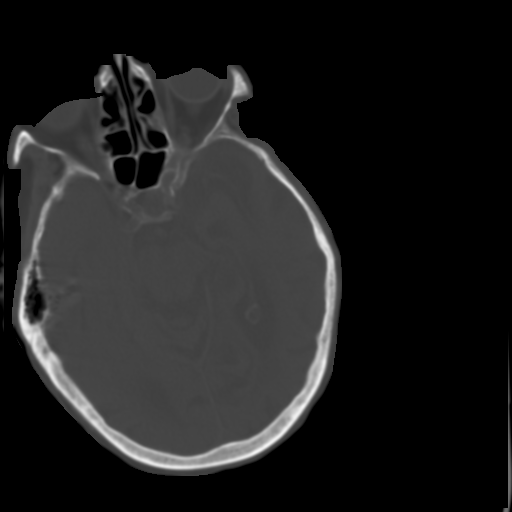
[im 12/36  brain]
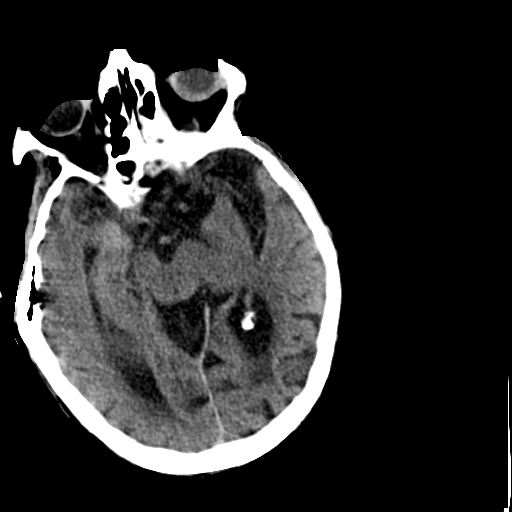
[im 16/36  brain]
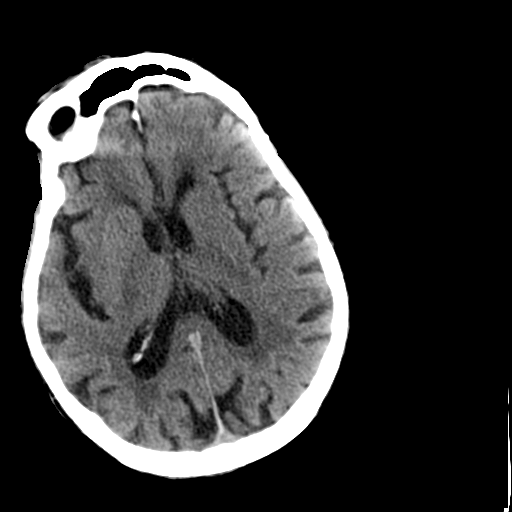
[im 17/36  brain]
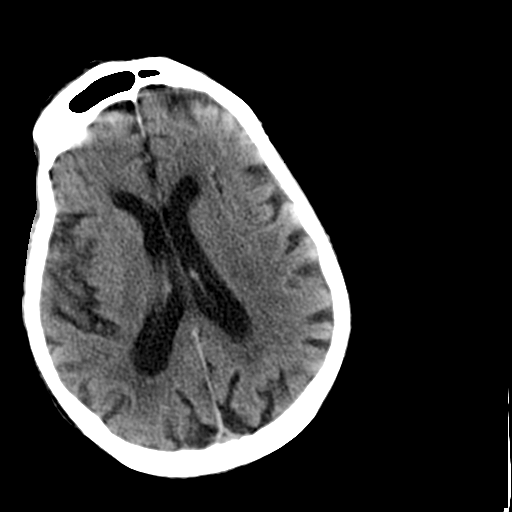
[im 19/36  brain]
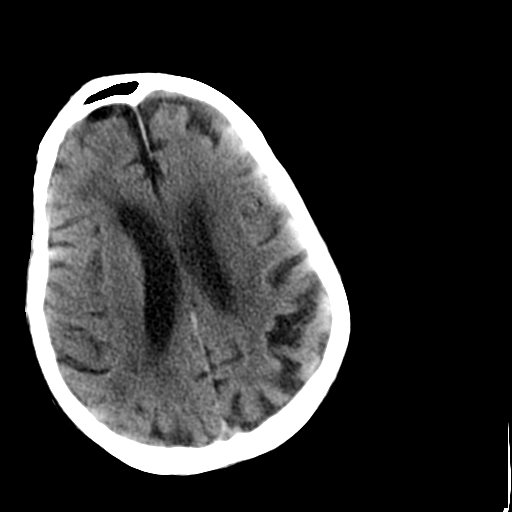
[im 19/36  bone]
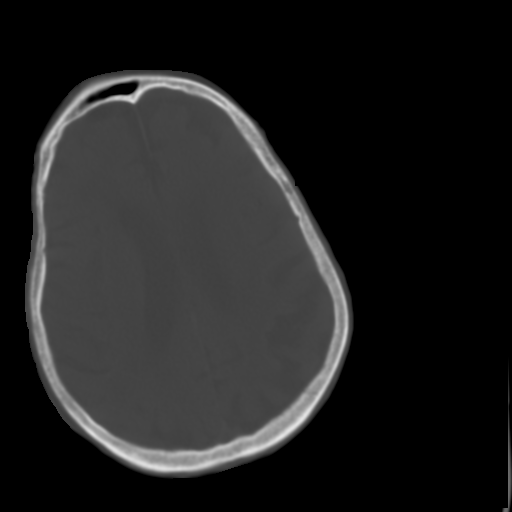
[im 21/36  brain]
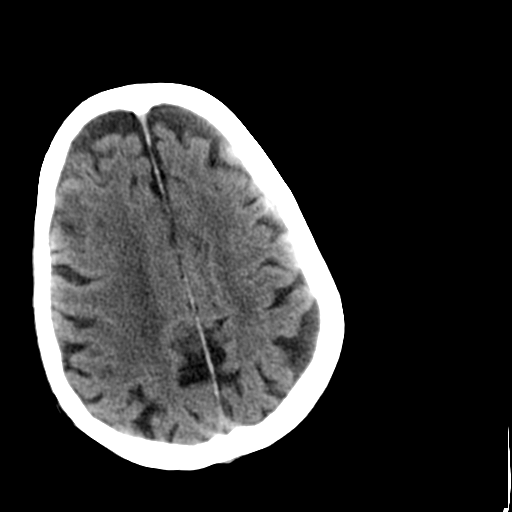
[im 24/36  brain]
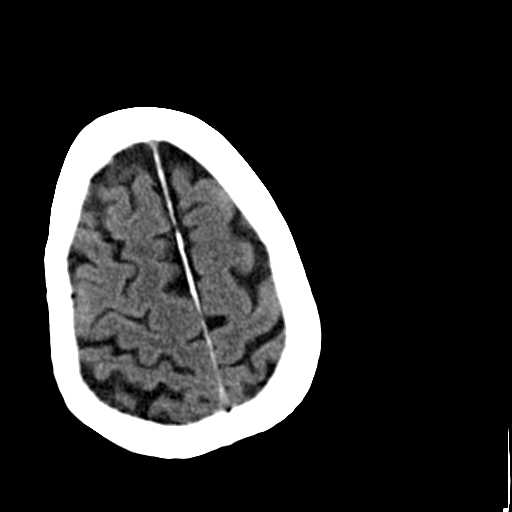
[im 26/36  brain]
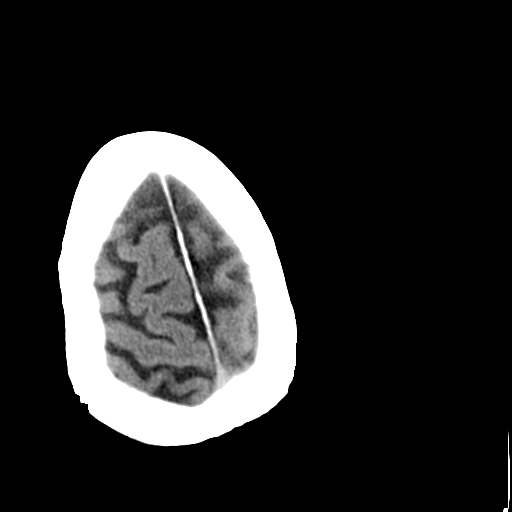
[im 27/36  brain]
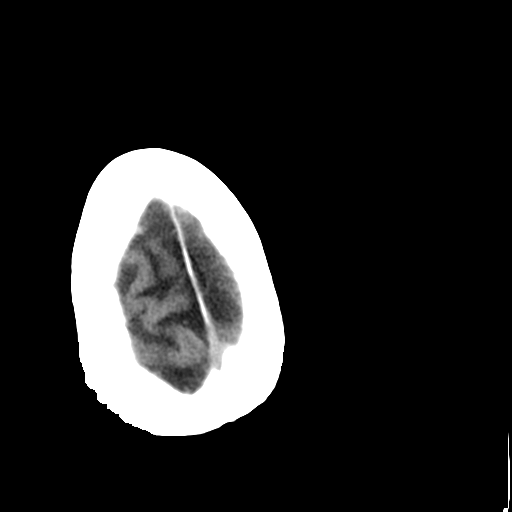
[im 27/36  bone]
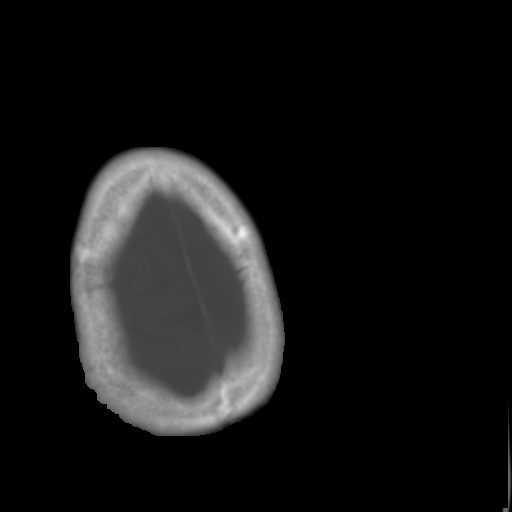
[im 29/36  brain]
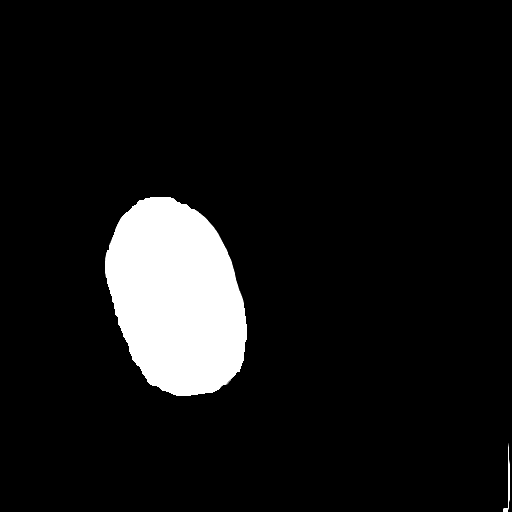
[im 32/36  brain]
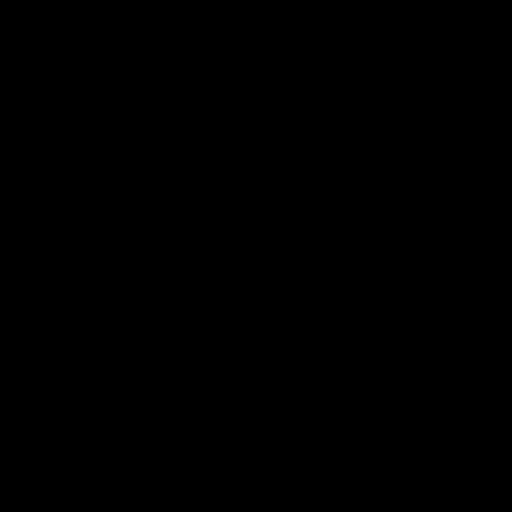
[im 34/36  brain]
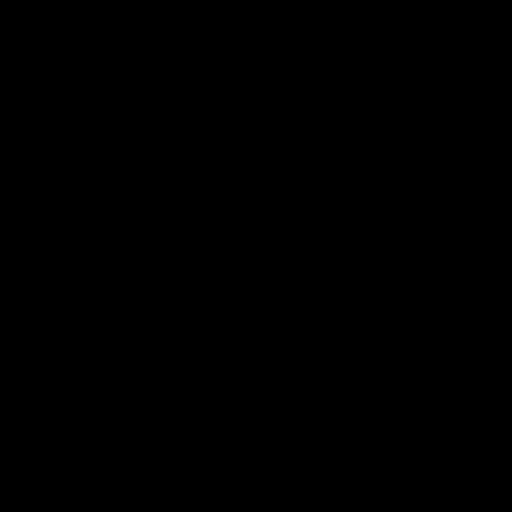

[16 of 30 positions shown; findings below may reference images not displayed]

FINDINGS: No evidence of parenchymal hemorrhage or extra-axial fluid
collection. No mass lesion, mass effect, or midline shift.

No CT evidence of acute infarction.

Subcortical white matter and periventricular small vessel ischemic
changes. Intracranial atherosclerosis.

Global cortical atrophy.  Secondary ventricular prominence.

Mild mucosal thickening in the left maxillary sinus. Visualized
paranasal sinuses and mastoid air cells are essentially clear.

No evidence of calvarial fracture.
IMPRESSION: No evidence of acute intracranial abnormality.

Atrophy with small vessel ischemic changes and intracranial
atherosclerosis.
